# Patient Record
Sex: Female | Born: 1965 | Race: White | Hispanic: No | Smoking: Current every day smoker
Health system: Southern US, Community
[De-identification: ages and names within clinical notes are randomized; demographics above are authoritative.]

## PROBLEM LIST (undated history)

## (undated) DIAGNOSIS — K219 Gastro-esophageal reflux disease without esophagitis: Secondary | ICD-10-CM

## (undated) DIAGNOSIS — G2581 Restless legs syndrome: Secondary | ICD-10-CM

## (undated) DIAGNOSIS — E119 Type 2 diabetes mellitus without complications: Secondary | ICD-10-CM

## (undated) DIAGNOSIS — Z860100 Personal history of colon polyps, unspecified: Secondary | ICD-10-CM

## (undated) DIAGNOSIS — G4700 Insomnia, unspecified: Secondary | ICD-10-CM

## (undated) DIAGNOSIS — N63 Unspecified lump in unspecified breast: Secondary | ICD-10-CM

## (undated) DIAGNOSIS — I10 Essential (primary) hypertension: Secondary | ICD-10-CM

## (undated) DIAGNOSIS — N809 Endometriosis, unspecified: Secondary | ICD-10-CM

## (undated) DIAGNOSIS — E785 Hyperlipidemia, unspecified: Secondary | ICD-10-CM

## (undated) DIAGNOSIS — Z9889 Other specified postprocedural states: Secondary | ICD-10-CM

## (undated) DIAGNOSIS — R519 Headache, unspecified: Secondary | ICD-10-CM

## (undated) DIAGNOSIS — R51 Headache: Secondary | ICD-10-CM

## (undated) DIAGNOSIS — K859 Acute pancreatitis without necrosis or infection, unspecified: Secondary | ICD-10-CM

## (undated) DIAGNOSIS — R112 Nausea with vomiting, unspecified: Secondary | ICD-10-CM

## (undated) HISTORY — DX: Essential (primary) hypertension: I10

## (undated) HISTORY — DX: Gastro-esophageal reflux disease without esophagitis: K21.9

## (undated) HISTORY — PX: RECTOCELE REPAIR: SHX761

## (undated) HISTORY — PX: DIAGNOSTIC LAPAROSCOPY: SUR761

## (undated) HISTORY — PX: TONSILLECTOMY: SUR1361

## (undated) HISTORY — PX: ABDOMINAL HYSTERECTOMY: SHX81

## (undated) HISTORY — PX: TMJ ARTHROPLASTY: SHX1066

## (undated) HISTORY — DX: Hyperlipidemia, unspecified: E78.5

## (undated) HISTORY — PX: OTHER SURGICAL HISTORY: SHX169

---
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
## 1999-08-24 ENCOUNTER — Encounter: Payer: Self-pay | Admitting: Internal Medicine

## 1999-08-24 ENCOUNTER — Encounter: Admission: RE | Admit: 1999-08-24 | Discharge: 1999-08-24 | Payer: Self-pay | Admitting: Internal Medicine

## 2000-05-03 ENCOUNTER — Other Ambulatory Visit: Admission: RE | Admit: 2000-05-03 | Discharge: 2000-05-03 | Payer: Self-pay | Admitting: Obstetrics & Gynecology

## 2001-02-14 ENCOUNTER — Encounter (INDEPENDENT_AMBULATORY_CARE_PROVIDER_SITE_OTHER): Payer: Self-pay | Admitting: Specialist

## 2001-02-14 ENCOUNTER — Ambulatory Visit (HOSPITAL_COMMUNITY): Admission: RE | Admit: 2001-02-14 | Discharge: 2001-02-14 | Payer: Self-pay | Admitting: Obstetrics & Gynecology

## 2002-01-03 ENCOUNTER — Other Ambulatory Visit: Admission: RE | Admit: 2002-01-03 | Discharge: 2002-01-03 | Payer: Self-pay | Admitting: Obstetrics & Gynecology

## 2002-03-29 ENCOUNTER — Encounter: Admission: RE | Admit: 2002-03-29 | Discharge: 2002-03-29 | Payer: Self-pay | Admitting: Gastroenterology

## 2002-03-29 ENCOUNTER — Encounter: Payer: Self-pay | Admitting: Gastroenterology

## 2002-09-11 ENCOUNTER — Emergency Department (HOSPITAL_COMMUNITY): Admission: EM | Admit: 2002-09-11 | Discharge: 2002-09-11 | Payer: Self-pay | Admitting: Emergency Medicine

## 2002-09-18 ENCOUNTER — Emergency Department (HOSPITAL_COMMUNITY): Admission: EM | Admit: 2002-09-18 | Discharge: 2002-09-18 | Payer: Self-pay | Admitting: Emergency Medicine

## 2003-02-05 ENCOUNTER — Other Ambulatory Visit: Admission: RE | Admit: 2003-02-05 | Discharge: 2003-02-05 | Payer: Self-pay | Admitting: Obstetrics & Gynecology

## 2003-05-02 ENCOUNTER — Observation Stay (HOSPITAL_COMMUNITY): Admission: RE | Admit: 2003-05-02 | Discharge: 2003-05-03 | Payer: Self-pay | Admitting: Obstetrics & Gynecology

## 2004-01-09 ENCOUNTER — Ambulatory Visit (HOSPITAL_COMMUNITY): Admission: RE | Admit: 2004-01-09 | Discharge: 2004-01-09 | Payer: Self-pay | Admitting: Gastroenterology

## 2004-02-26 ENCOUNTER — Other Ambulatory Visit: Admission: RE | Admit: 2004-02-26 | Discharge: 2004-02-26 | Payer: Self-pay | Admitting: Obstetrics & Gynecology

## 2004-10-10 ENCOUNTER — Ambulatory Visit (HOSPITAL_COMMUNITY): Admission: RE | Admit: 2004-10-10 | Discharge: 2004-10-10 | Payer: Self-pay | Admitting: Internal Medicine

## 2005-04-12 ENCOUNTER — Other Ambulatory Visit: Admission: RE | Admit: 2005-04-12 | Discharge: 2005-04-12 | Payer: Self-pay | Admitting: Obstetrics & Gynecology

## 2005-04-20 ENCOUNTER — Encounter: Admission: RE | Admit: 2005-04-20 | Discharge: 2005-04-20 | Payer: Self-pay | Admitting: Obstetrics & Gynecology

## 2006-11-02 ENCOUNTER — Encounter: Admission: RE | Admit: 2006-11-02 | Discharge: 2006-11-02 | Payer: Self-pay | Admitting: Internal Medicine

## 2007-05-11 ENCOUNTER — Encounter: Admission: RE | Admit: 2007-05-11 | Discharge: 2007-05-11 | Payer: Self-pay | Admitting: Obstetrics & Gynecology

## 2010-10-11 ENCOUNTER — Encounter: Payer: Self-pay | Admitting: Obstetrics & Gynecology

## 2010-10-22 ENCOUNTER — Other Ambulatory Visit: Payer: Self-pay | Admitting: Internal Medicine

## 2010-10-23 ENCOUNTER — Ambulatory Visit
Admission: RE | Admit: 2010-10-23 | Discharge: 2010-10-23 | Disposition: A | Discharge status: Home / Self Care | Payer: Self-pay | Source: Ambulatory Visit | Attending: Internal Medicine | Admitting: Internal Medicine

## 2010-11-02 ENCOUNTER — Other Ambulatory Visit (HOSPITAL_COMMUNITY): Payer: Self-pay | Admitting: Gastroenterology

## 2010-11-02 DIAGNOSIS — R1013 Epigastric pain: Secondary | ICD-10-CM

## 2010-11-12 ENCOUNTER — Ambulatory Visit (HOSPITAL_COMMUNITY)
Admission: RE | Admit: 2010-11-12 | Discharge: 2010-11-12 | Disposition: A | Discharge status: Home / Self Care | Payer: 59 | Source: Ambulatory Visit | Attending: Gastroenterology | Admitting: Gastroenterology

## 2010-11-12 DIAGNOSIS — R1013 Epigastric pain: Secondary | ICD-10-CM

## 2010-11-12 DIAGNOSIS — R110 Nausea: Secondary | ICD-10-CM | POA: Insufficient documentation

## 2010-11-12 DIAGNOSIS — R109 Unspecified abdominal pain: Secondary | ICD-10-CM | POA: Insufficient documentation

## 2010-11-12 DIAGNOSIS — K3189 Other diseases of stomach and duodenum: Secondary | ICD-10-CM | POA: Insufficient documentation

## 2010-11-12 MED ORDER — TECHNETIUM TC 99M SULFUR COLLOID
2.0000 | Freq: Once | INTRAVENOUS | Status: AC | PRN
  Administered 2010-11-12: 2 via INTRAVENOUS

## 2010-11-26 ENCOUNTER — Other Ambulatory Visit: Payer: Self-pay | Admitting: Gastroenterology

## 2010-11-26 ENCOUNTER — Ambulatory Visit (HOSPITAL_COMMUNITY)
Admission: RE | Admit: 2010-11-26 | Discharge: 2010-11-26 | Disposition: A | Discharge status: Home / Self Care | Payer: 59 | Source: Ambulatory Visit | Attending: Gastroenterology | Admitting: Gastroenterology

## 2010-11-26 DIAGNOSIS — I10 Essential (primary) hypertension: Secondary | ICD-10-CM | POA: Insufficient documentation

## 2010-11-26 DIAGNOSIS — K219 Gastro-esophageal reflux disease without esophagitis: Secondary | ICD-10-CM | POA: Insufficient documentation

## 2010-11-26 DIAGNOSIS — R110 Nausea: Secondary | ICD-10-CM | POA: Insufficient documentation

## 2010-11-26 DIAGNOSIS — R6881 Early satiety: Secondary | ICD-10-CM | POA: Insufficient documentation

## 2010-12-03 NOTE — Op Note (Signed)
  NAMEJERILEE, SPACE                  ACCOUNT NO.:  [id number]  MEDICAL RECORD NO.:  [id number]           PATIENT TYPE:  O  LOCATION:  WLEN                         FACILITY:  [facility]  PHYSICIAN:  Danise Edge, M.D.   DATE OF BIRTH:  Sep 10, 1966  DATE OF PROCEDURE:  11/26/2010 DATE OF DISCHARGE:                              OPERATIVE REPORT   PROCEDURE:  Diagnostic esophagogastroduodenoscopy report.  REFERRING PHYSICIAN:  Thora Lance, M.D.  HISTORY:  Ms. Diamond [name] is a 45 year old female born on 1966/04/19.  The patient has chronic gastroesophageal reflux well controlled on proton pump inhibitor therapy.  She takes ReQuip at bedtime, but does not use nonsteroidal antiinflammatory medication.  She reports no heartburn, dysphagia, or dilatation.  For approximately 2 years, the patient has experienced early morning nausea without vomiting or abdominal pain.  Her episodes of nausea are increasing in frequency.  She also notes intermittent early satiety and bloating.  Two hours after eating a meal, the patient feels extremely hungry.  The patient's abdominal ultrasound was normal, but did suggest fatty infiltration of the liver.  Her CBC with differential, amylase, lipase, urinalysis, and complete metabolic profile were normal.  Last year, her thyroid stimulating hormone level was normal.  The patient underwent a nuclear medicine solid-phase gastric emptying study, which was carried out to 2 hours and showed 60% gastric retention at 2 hours.  The patient is scheduled to undergo diagnostic esophagogastroduodenoscopy to rule out mechanical gastric outlet obstruction.  ENDOSCOPIST:  Danise Edge, M.D.  PREMEDICATION:  Propofol administered by Anesthesia.  DESCRIPTION OF PROCEDURE:  After obtaining informed consent, the patient was placed in the left lateral decubitus position.  The Pentax gastroscope was passed through the posterior hypopharynx into  the proximal esophagus without difficulty.  The hypopharynx, larynx, and vocal cords appeared normal.  ESOPHAGOSCOPY:  The proximal, mid, and lower segments of the esophageal mucosa appeared normal.  The squamocolumnar junction is regular and noted at 40 cm from the incisor teeth.  There is no endoscopic evidence for the presence of erosive esophagitis, Barrett's esophagus, or esophageal stricture formation.  GASTROSCOPY:  Retroflexed view of the gastric cardia and fundus was normal.  The gastric body, antrum, and pylorus appeared normal.  The pylorus is patent.  DUODENOSCOPY:  The duodenal bulb and descending duodenum appeared normal.  GASTRIC BIOPSIES:  Random gastric biopsies were performed to look for H. pylori gastritis.  ASSESSMENT:  Normal esophagogastroduodenoscopy.  Gastric biopsies to rule out H. pylori gastritis pending.  No signs of Barrett's esophagus, erosive esophagitis, or mechanical gastric outlet obstruction.          ______________________________ Danise Edge, M.D.     MJ/MEDQ  D:  11/26/2010  T:  11/27/2010  Job:  [id number]  cc:   Thora Lance, M.D. Fax: [phone]  Electronically Signed by Danise Edge M.D. on 12/03/2010 04:54:26 PM

## 2011-02-05 NOTE — Op Note (Signed)
[facility]  Patient:    Diamond [name], Diamond [name] Visit Number: [id number] MRN: [id number]          Service Type: Attending:  Freddy Finner, M.D. Proc. Date: 02/14/01                             Operative Report  PREOPERATIVE DIAGNOSES:       1. Chronic right pelvic pain.                               2. Previous history of endometriosis.  POSTOPERATIVE DIAGNOSES:      1. Filmy pelvic adhesions.                               2. Multicystic right ovary.  OPERATION:                    1. Laparoscopy with lysis of adhesions.                               2. Right salpingo-oophorectomy.  SURGEON:                      Freddy Finner, M.D.  ANESTHESIA:                   General endotracheal.  ESTIMATE INTRAOPERATIVE BLOOD LOSS:  20 cc.  INTRAOPERATIVE COMPLICATIONS:     None.  INDICATIONS:                  The patient is a 45 year old who has previously had a hysterectomy and left salpingo-oophorectomy, with a history of endometriosis.  She has had chronic pain over the last one to two years in the right adnexa and lower abdomen.  She is admitted now for a laparoscopy.  FINDINGS:                     Are recorded in the still photographs and are essentially as noted in the postoperative diagnosis.  DESCRIPTION OF PROCEDURE:     The patient was admitted on the morning of surgery and was brought to the operating room and placed under adequate general endotracheal anesthesia, and placed in the dorsal lithotomy position using the Allen stirrups system.  A Betadine prep of the abdomen was carried out.  The bladder was evacuated with a sterile technique.  Sterile drapes were applied.  Two small incisions were made, one at the umbilicus, one just above the symphysis.  Through the upper incision the 11 mm disposable trocar was introduced while elevating the anterior abdominal wall manually.  Direct inspection revealed an adequate placement.  No evidence of injury on  entry.  A pneumoperitoneum was allowed to accumulate with carbon dioxide gas.  A 5 mm trocar was placed in the lower incision under direct visualization.  Through it a blunt probe.  Grasping forceps and later the Nezhat irrigation system were used.  Blunt dissection of adhesions easily freed those in the left and right pelvis.  They were very filmy.  These were adhesions to the sigmoid or epiploica sigmoid.  The ovary was multicystic and irregular in overall contour, but otherwise normal.  The ovary and tube were grasped with the grasping forceps  in the lower trocar sleeve.  Using the bipolar Klepinger forceps through the operating channel of the laparoscope the infundibular pelvic ligament and further attachment of the ovary was progressively fulgurated and sharply divided with the hot Bell scissors.  Hemostasis was established.  It was complete.  The ovary was more slowly removed in pieces through the trocar sleeve.  Irrigation was used with the Nezhat probe, and careful inspection revealed some small oozing sources which were controlled, while there was a little defect on the peritoneum anteriorly, which occurred during the morcellization of the ovary.  With complete hemostasis, all of the irrigation solution was aspirated from the abdomen.  Gas was allowed to escape.  The instruments were removed.  The skin incisions were closed with interrupted subcuticular sutures of #3-0 Dexon with Steri-Strips.  ________ 0.25% was injected through the incision sites for postoperative analgesia. Attending:  Freddy Finner, M.D. DD:  02/14/01 TD:  02/14/01 Job: [id number]

## 2011-02-05 NOTE — Op Note (Signed)
NAME:  Diamond [name], Diamond [name]                            ACCOUNT NO.:  [id number]   MEDICAL RECORD NO.:  [id number]                   PATIENT TYPE:  AMB   LOCATION:  ENDO                                 FACILITY:  [facility]   PHYSICIAN:  Danise Edge, M.D.                DATE OF BIRTH:  06/01/1966   DATE OF PROCEDURE:  01/09/2004  DATE OF DISCHARGE:                                 OPERATIVE REPORT   PROCEDURE:  Esophagogastroduodenoscopy.   INDICATIONS:  Ms. Diamond [name] is [name] 45 year old female born on October 13, 1965.  Diamond [name] has chronic gastroesophageal reflux manifested by  heartburn, regurgitation, and early satiety without nausea, vomiting, or  gastrointestinal bleeding.  Her current dose of Aciphex keeps her heartburn  under control, but not her regurgitation.  If she simply bends over to tie  her shoe, she will experience regurgitation.  She reports no dysphagia or  odynophagia.   On May 30, 2002, her barium swallow and upper GI x-ray series revealed  [name] patchulous, gastroesophageal junction without hiatal hernia, normal  transit of the 13 mm barium tablet into the stomach, and [name] normal stomach  and duodenum.   Her July, 2003 upper GI x-ray series demonstrated emptying of barium from  her stomach rapidly.  The patient tells me she feels full if she eats only [name]  few bites of food.   ENDOSCOPIST:  Danise Edge, M.D.   PREMEDICATION:  Versed 10 mg, Demerol 70 mg.   PROCEDURE:  After obtaining informed consent, Diamond [name] was placed in the  left lateral decubitus position.  I administered intravenous Demerol and  intravenous Versed to achieve conscious sedation for the procedure.  Patient's blood pressure, oxygen saturation, and cardiac rhythm were  monitored throughout the procedure and documented in the medical record.   The Olympus gastroscope was passed through the posterior hypopharynx and  into the proximal esophagus without difficulty.  The hypopharynx and larynx  appeared normal.  I did not visualize the vocal cords.   ESOPHAGOSCOPY:  The proximal, mid, and lower segments of the esophageal  mucosa appear completely normal.  The squamocolumnar junction is noted at 40  cm from the incisor teeth.  There is no endoscopic evidence for the presence  of erosive esophagitis, esophageal mucosal scarring, or Barrett's esophagus.   GASTROSCOPY:  Diamond [name] has [name] small hiatal hernia.  Retroflexed view of the  gastric cardia and fundus was normal.  The diaphragmatic hiatus was slightly  patulous.  The gastric body, antrum, and pylorus appeared normal.  The  pylorus is completely patent.   DUODENOSCOPY:  The duodenal bulb and descending duodenum appear normal.   ASSESSMENT:  Gastroesophageal reflux associated with [name] small hiatal hernia  and slightly patulous diaphragmatic hiatus, otherwise completely normal  esophagogastroduodenoscopy.   PLAN:  I will assess Diamond [name]'s response to Aciphex twice daily.  If the  response is poor,  I will add Reglan.  If no response, I will refer her for  consideration for antireflux surgery.                                               Danise Edge, M.D.    MJ/MEDQ  D:  01/09/2004  T:  01/10/2004  Job:  [id number]   cc:   Diamond [name], M.D.  [address]  [place]  [place] [address]  Fax: [phone]

## 2011-02-05 NOTE — H&P (Signed)
[name], [name]                            ACCOUNT NO.:  [id number]   MEDICAL RECORD NO.:  [id number]                   PATIENT TYPE:  AMB   LOCATION:  SDC                                  FACILITY:  [facility]   PHYSICIAN:  Freddy Finner, M.D.                DATE OF BIRTH:  January 09, 1966   DATE OF ADMISSION:  05/02/2003  DATE OF DISCHARGE:                                HISTORY & PHYSICAL   ADMISSION DIAGNOSIS:  Symptomatic rectocele.   HISTORY OF PRESENT ILLNESS:  The patient is a 45 year old white married  female who has significant past GYN history including laparoscopically-  assisted vaginal hysterectomy, left salpingo-oophorectomy in 1994 for  chronic pelvic pain and dysfunctional uterine bleeding.  She also was found  to have endometriosis.  In 2002, she had a right salpingo-oophorectomy and  lysis of pelvic adhesions for chronic right pelvic pain.  Her GYN symptoms  have been unremarkable until approximately three to six months prior to this  procedure when she noted some marked increased difficulty with emptying her  bowel. She has been evaluated by Danise Edge, M.D. for GI evaluation.  Her only abnormal finding is a rectocele which she has requested to be  surgically repaired.  She is admitted at this time for that purpose.   PAST MEDICAL HISTORY:  The patient is known to have irritable bowel  syndrome.  She has no other know significant medical illnesses.   PAST SURGICAL HISTORY:  She has had numerous operative procedures including  TMJ in 1997, diagnostic laparoscopy in 1982, the two surgical procedures  noted above.  She had surgical sterilization in 1990.   CURRENT MEDICATIONS:  None except for hormone replacement therapy.   SOCIAL HISTORY:  She is not a smoker.  She has never had a blood  transfusion.   ALLERGIES:  No known drug allergies.   FAMILY HISTORY:  Noncontributory.   PHYSICAL EXAMINATION:  HEENT:  Grossly within normal limits.  Thyroid gland  is not  palpably enlarged.  VITAL SIGNS:  Blood pressure in the office is 106/70.  CHEST:  Clear to auscultation.  HEART:  Normal sinus rhythm without murmurs, rubs, or gallops.  BREASTS: Normal, no palpable masses, no skin change, no nipple discharge.  ABDOMEN:  Soft and nontender without appreciable organomegaly or palpable  masses.  PELVIC:  Rectocele is noted. The cuff is otherwise normal. Bimanual  examination is normal including rectovaginal examination.  EXTREMITIES:  Without cyanosis, clubbing, or edema.   ASSESSMENT:  Symptomatic rectocele.    PLAN:  Posterior vaginal colporrhaphy.  The procedure has been described to  the patient in detail including the potential risks of the procedure.  She  has been asked to use a modified bowel prep including Milk of Magnesia at  bedtime for two days prior to surgery.  Freddy Finner, M.D.    WRN/MEDQ  D:  05/01/2003  T:  05/01/2003  Job:  [id number]

## 2011-02-05 NOTE — Op Note (Signed)
   NAMEJENNIER, Diamond [name]                            ACCOUNT NO.:  [id number]   MEDICAL RECORD NO.:  [id number]                   PATIENT TYPE:  OBV   LOCATION:  9305                                 FACILITY:  [facility]   PHYSICIAN:  Freddy Finner, M.D.                DATE OF BIRTH:  01/03/1966   DATE OF PROCEDURE:  05/02/2003  DATE OF DISCHARGE:                                 OPERATIVE REPORT   PREOPERATIVE DIAGNOSIS:  Symptomatic rectocele.   POSTOPERATIVE DIAGNOSIS:  Symptomatic rectocele.   OPERATIVE PROCEDURE:  Posterior colporrhaphy.   SURGEON:  Freddy Finner, M.D.   ANESTHESIA:  General.   ESTIMATED INTRAOPERATIVE BLOOD LOSS:  200 mL.   INTRAOPERATIVE COMPLICATIONS:  None.   Details of the present illness are recorded in the admission note.  The  patient was admitted on the morning of surgery.  She was given an antibiotic  bolus preoperatively.  She was placed in PAS hose.  She was brought to the  operating room and placed under adequate general anesthesia.  She was placed  in the dorsolithotomy position using the [employer].  Betadine  prep with scrub followed by solution was carried out in the usual fashion.  Sterile drapes were applied.  The fourchette was grasped on each side with  an Allis clamp.  A pyramidal shaped segment of skin was excised from the  peritoneal body with apex anteriorly.  The mucosa overlying the rectum was  tented with an Allis and with progressive sharp and blunt dissection the  mucosa was dissected off of the rectum and perirectal tissues and a midline  incision made for a distance of approximately 6-8 cm.  After very careful  dissection, plication sutures were then placed using the perirectal tissue  to recreate the rectovaginal septum using 0 Monocryl pop-off sutures.  This  was also used to approximate the perineal body.  The small segments of  mucosa were excised.  The mucosa was then closed with running 2-0 Monocryl  in a fashion  similar to episiotomy.  The procedure was accomplished without  significant difficulty.  The patient was then awaken and taken to recovery  in good condition.                                               Freddy Finner, M.D.    WRN/MEDQ  D:  05/02/2003  T:  05/02/2003  Job:  [id number]

## 2011-02-05 NOTE — Discharge Summary (Signed)
   [name], Diamond [name]                            ACCOUNT NO.:  [id number]   MEDICAL RECORD NO.:  [id number]                   PATIENT TYPE:  OBV   LOCATION:  9305                                 FACILITY:  [facility]   PHYSICIAN:  Freddy Finner, M.D.                DATE OF BIRTH:  June 12, 1966   DATE OF ADMISSION:  05/02/2003  DATE OF DISCHARGE:  05/03/2003                                 DISCHARGE SUMMARY   DISCHARGE DIAGNOSIS:  Symptomatic rectocele.   OPERATIVE PROCEDURE:  Posterior colporrhaphy with repair of rectocele.   POSTOPERATIVE COMPLICATIONS:  None.   DISPOSITION:  The patient was in satisfactory and improved condition after  an observation night following surgery.  She was discharged home on stool  softener with progressively increasing physical activity, no vaginal entry,  instructions to call for fever or heavy bleeding.  She was to use Vicodin  and ibuprofen as needed for postoperative pain.   HOSPITAL COURSE:  Details of the present illness are recorded in the  admission note.  The patient was admitted on the morning of surgery.  Her  physical findings were remarkable for a rectocele.   Laboratory data during this admission includes a preoperative hemoglobin of  14.9, postoperative hemoglobin of 12.5, admission prothrombin time and PTT  which were normal.   The patient was admitted on the morning of surgery.  The above-described  operative procedure was accomplished.  There were no intraoperative or  postoperative complications.  She was observed overnight for postoperative  follow-up and by the morning of surgery was tolerating a regular diet,  ambulating without difficulty, having adequate bladder function.  Her  disposition is noted above.                                               Freddy Finner, M.D.    WRN/MEDQ  D:  05/22/2003  T:  05/22/2003  Job:  [id number]

## 2011-07-14 ENCOUNTER — Other Ambulatory Visit: Payer: Self-pay | Admitting: Obstetrics & Gynecology

## 2011-07-14 DIAGNOSIS — N63 Unspecified lump in unspecified breast: Secondary | ICD-10-CM

## 2011-07-27 ENCOUNTER — Ambulatory Visit
Admission: RE | Admit: 2011-07-27 | Discharge: 2011-07-27 | Disposition: A | Discharge status: Home / Self Care | Payer: 59 | Source: Ambulatory Visit | Attending: Obstetrics & Gynecology | Admitting: Obstetrics & Gynecology

## 2011-07-27 ENCOUNTER — Other Ambulatory Visit: Payer: Self-pay | Admitting: Obstetrics & Gynecology

## 2011-07-27 DIAGNOSIS — N63 Unspecified lump in unspecified breast: Secondary | ICD-10-CM

## 2013-02-19 ENCOUNTER — Encounter: Payer: Self-pay | Admitting: *Deleted

## 2013-02-19 ENCOUNTER — Encounter: Payer: 59 | Attending: Internal Medicine | Admitting: *Deleted

## 2013-02-19 VITALS — Ht 63.5 in | Wt 195.0 lb

## 2013-02-19 DIAGNOSIS — E781 Pure hyperglyceridemia: Secondary | ICD-10-CM | POA: Insufficient documentation

## 2013-02-19 DIAGNOSIS — Z713 Dietary counseling and surveillance: Secondary | ICD-10-CM | POA: Insufficient documentation

## 2013-02-19 DIAGNOSIS — E669 Obesity, unspecified: Secondary | ICD-10-CM

## 2013-02-19 DIAGNOSIS — K219 Gastro-esophageal reflux disease without esophagitis: Secondary | ICD-10-CM | POA: Insufficient documentation

## 2013-02-19 NOTE — Progress Notes (Signed)
  Medical Nutrition Therapy:  Appt start time: 1430 end time:  1530.   Assessment:  Primary concerns today: Diamond [name] is here for nutrition counseling pertaining to obesity.  She reports always struggling with her weight as an adult.  After having her second child she has had a very difficult time managing her weight.  During emotional times, she looses weight from not eating.  She has GERD and gastroparesis and elevated triglycerides, cholesterol, and there are some concerns about hyperglycemia.  She is quite concerned about her weight/health.  She feels like she doesn't eat very much and she doesn't understand what the difficulty is with her weight.  She had a hysterectomy years ago due to cystic ovaries.  She is receiving HRT currently.  She has facial hair that she shaves off and periodic body acne.  She used to eat fast food regularly, but doesn't anymore and is quite upset about her triglyceride levels.  She eats smaller portions due to her gastroparesis and denies overeating.   MEDICATIONS: see    DIETARY INTAKE:  Usual eating pattern includes 3 meals and 2-3 snacks per day.  Everyday foods include proteins, refined carbohydrates some vegetables.  States she's a picky eater.  Loves salty foods and sugary cereals.  Drinks whole milk.  Avoided foods include cooked vegetables.  She likes raw vegetables, but not cooked ones.  .    24-hr recall:  B ( AM): granola bar, lance crackers, BLT sometimes at work.  Used to have ham biscuit qd, but now only 1/week Snk ( AM): maybe cracker if nauseous L ( PM): Diamond [name] pizza, rarely fast food.  Might get burger rarely.  Salads,  Loves bread Snk ( PM): chips maybe D ( PM): varies: cereal, banana sandwich with mayo on whitewheat bread, PB and J, chips, or might go out.  Never cooks at home.  Might get [place] or pizza or [place].   Snk ( PM): might have chips or chocolate pudding if she ate a light supper Beverages: water, sweet tea  Usual physical  activity: none.  Is doing PT with urologist   Estimated energy needs: 1600 calories 180 g carbohydrates 120 g protein 44 g fat  Progress Towards Goal(s):  In progress.   Nutritional Diagnosis:  NB-1.1 Food and nutrition-related knowledge deficit As related to proper balance of fats, carbohydrates, and proteins.  As evidenced by BMI and elevated lab values.    Intervention:  Nutrition counseling provided.  Suggested speaking with her doctor about additional testing: possibly thyroid, possibly insulin, possibly testosterone levels.  She has some symptoms consistent with PCOS, but it's difficult to tell due to her hysterectomy and HRT.  Encouraged 5-6 small eating occasions throughout the day: carbohydrate and protein at each "meal."  Suggested limiting sweet tea and potato chips.  Also suggested adding walking into her routine.  Suggested more water and healthy fats (nuts, peanut butter) as snacks.    Monitoring/Evaluation:  Dietary intake, exercise, lab data, and body weight prn.

## 2013-02-19 NOTE — Progress Notes (Deleted)
  Medical Nutrition Therapy:  Appt start time: {Time; Appointment:21385} end time:  {Time; Appointment:21385}.   Assessment:  Primary concerns today: Diamond [name] is here for n.  She has e MEDICATIONS: see    DIETARY INTAKE:  Usual eating pattern includes *** meals and *** snacks per day.  Everyday foods include ***.  Avoided foods include ***.    24-hr recall:  B ( AM): gran  Snk ( AM): ***  L ( PM): *** Snk ( PM): *** D ( PM): *** Snk ( PM): *** Beverages: ***  Usual physical activity: ***  Estimated energy needs: *** calories *** g carbohydrates *** g protein *** g fat  Progress Towards Goal(s):  {Desc; Goals Progress:21388}.   Nutritional Diagnosis:  {CHL AMB NUTRITIONAL DIAGNOSIS:[phone]}    Intervention:  Nutrition conse.  Handouts given during visit include:  ***  ***  Monitoring/Evaluation:  Dietary intake, exercise, ***, and body weight {follow up:15908}.

## 2013-05-09 ENCOUNTER — Emergency Department (HOSPITAL_COMMUNITY)
Admission: EM | Admit: 2013-05-09 | Discharge: 2013-05-10 | Disposition: A | Discharge status: Home / Self Care | Payer: 59 | Attending: Emergency Medicine | Admitting: Emergency Medicine

## 2013-05-09 ENCOUNTER — Emergency Department (HOSPITAL_COMMUNITY): Payer: 59

## 2013-05-09 ENCOUNTER — Encounter (HOSPITAL_COMMUNITY): Payer: Self-pay | Admitting: Emergency Medicine

## 2013-05-09 DIAGNOSIS — Z862 Personal history of diseases of the blood and blood-forming organs and certain disorders involving the immune mechanism: Secondary | ICD-10-CM | POA: Insufficient documentation

## 2013-05-09 DIAGNOSIS — Z8639 Personal history of other endocrine, nutritional and metabolic disease: Secondary | ICD-10-CM | POA: Insufficient documentation

## 2013-05-09 DIAGNOSIS — K209 Esophagitis, unspecified without bleeding: Secondary | ICD-10-CM | POA: Insufficient documentation

## 2013-05-09 DIAGNOSIS — R0789 Other chest pain: Secondary | ICD-10-CM

## 2013-05-09 DIAGNOSIS — I10 Essential (primary) hypertension: Secondary | ICD-10-CM | POA: Insufficient documentation

## 2013-05-09 DIAGNOSIS — Z79899 Other long term (current) drug therapy: Secondary | ICD-10-CM | POA: Insufficient documentation

## 2013-05-09 DIAGNOSIS — K219 Gastro-esophageal reflux disease without esophagitis: Secondary | ICD-10-CM | POA: Insufficient documentation

## 2013-05-09 DIAGNOSIS — IMO0002 Reserved for concepts with insufficient information to code with codable children: Secondary | ICD-10-CM | POA: Insufficient documentation

## 2013-05-09 DIAGNOSIS — Z8669 Personal history of other diseases of the nervous system and sense organs: Secondary | ICD-10-CM | POA: Insufficient documentation

## 2013-05-09 DIAGNOSIS — F17200 Nicotine dependence, unspecified, uncomplicated: Secondary | ICD-10-CM | POA: Insufficient documentation

## 2013-05-09 HISTORY — DX: Restless legs syndrome: G25.81

## 2013-05-09 LAB — CBC
HCT: 41.7 % (ref 36.0–46.0)
MCHC: 35.5 g/dL (ref 30.0–36.0)
Platelets: 237 10*3/uL (ref 150–400)
RDW: 13.6 % (ref 11.5–15.5)
WBC: 12.7 10*3/uL — ABNORMAL HIGH (ref 4.0–10.5)

## 2013-05-09 MED ORDER — GI COCKTAIL ~~LOC~~
30.0000 mL | Freq: Once | ORAL | Status: AC
  Administered 2013-05-10: 30 mL via ORAL
  Filled 2013-05-09: qty 30

## 2013-05-09 NOTE — ED Notes (Signed)
PT. REPORTS MID CHEST PAIN AND UPPER BACK ONSET LAST NIGHT WITH SLIGHT SOB AND NAUSEA .

## 2013-05-10 ENCOUNTER — Emergency Department (HOSPITAL_COMMUNITY): Payer: 59

## 2013-05-10 LAB — HEPATIC FUNCTION PANEL
ALT: 11 U/L (ref 0–35)
AST: 14 U/L (ref 0–37)
Alkaline Phosphatase: 46 U/L (ref 39–117)
Bilirubin, Direct: <0.1 mg/dL (ref 0.0–0.3)
Total Bilirubin: 0.1 mg/dL — ABNORMAL LOW (ref 0.3–1.2)

## 2013-05-10 LAB — BASIC METABOLIC PANEL
BUN: 12 mg/dL (ref 6–23)
Chloride: 100 mEq/L (ref 96–112)
GFR calc Af Amer: >90 mL/min (ref >90)
GFR calc non Af Amer: >90 mL/min (ref >90)
Potassium: 3.2 mEq/L — ABNORMAL LOW (ref 3.5–5.1)

## 2013-05-10 LAB — PRO B NATRIURETIC PEPTIDE: Pro B Natriuretic peptide (BNP): 37.1 pg/mL (ref 0–125)

## 2013-05-10 MED ORDER — HYDROMORPHONE HCL PF 1 MG/ML IJ SOLN
0.5000 mg | Freq: Once | INTRAMUSCULAR | Status: AC
  Administered 2013-05-10: 0.5 mg via INTRAMUSCULAR
  Filled 2013-05-10: qty 1

## 2013-05-10 MED ORDER — ONDANSETRON 4 MG PO TBDP
8.0000 mg | ORAL_TABLET | Freq: Once | ORAL | Status: AC
  Administered 2013-05-10: 8 mg via ORAL
  Filled 2013-05-10: qty 2

## 2013-05-10 MED ORDER — SUCRALFATE 1 G PO TABS: 1.0000 g | ORAL_TABLET | Freq: Four times a day (QID) | ORAL | Status: DC

## 2013-05-10 MED ORDER — HYDROCODONE-ACETAMINOPHEN 5-325 MG PO TABS: 2.0000 | ORAL_TABLET | ORAL | Status: DC | PRN

## 2013-05-10 MED ORDER — HYDROMORPHONE HCL PF 1 MG/ML IJ SOLN: 0.5000 mg | Freq: Once | INTRAMUSCULAR | Status: DC

## 2013-05-10 MED ORDER — FENTANYL CITRATE 0.05 MG/ML IJ SOLN
50.0000 ug | Freq: Once | INTRAMUSCULAR | Status: DC
  Filled 2013-05-10: qty 2

## 2013-05-10 MED ORDER — ONDANSETRON 8 MG PO TBDP: 8.0000 mg | ORAL_TABLET | Freq: Three times a day (TID) | ORAL | Status: DC | PRN

## 2013-05-10 MED ORDER — SUCRALFATE 1 G PO TABS
1.0000 g | ORAL_TABLET | Freq: Once | ORAL | Status: AC
  Administered 2013-05-10: 1 g via ORAL
  Filled 2013-05-10: qty 1

## 2013-05-10 NOTE — ED Notes (Signed)
Pt c/o nausea, EDP made aware 

## 2013-05-10 NOTE — ED Provider Notes (Signed)
CSN: [id number]     Arrival date & time 05/09/13  2310 History     First MD Initiated Contact with Patient 05/09/13 2338     Chief Complaint  Patient presents with  . Chest Pain   (Consider location/radiation/quality/duration/timing/severity/associated sxs/prior Treatment) HPI 47 yo female presents to the ER from home with complaint of central chest pain with radiation into her back between the shoulder blades.  Pain started last night, attributed to gas.  Started up again this afternoon and has gotten progressively worse.  Pain has been constant since afternoon.  Pain is sharp.  No diaphoresis, some mild sob.  No fever, chills.  No prior h/o same.  Pt has taken full strength aspirin x 2 and tums without relief.  Pt has h/o HTN, HLD, and GERD.  Pt reports h/o "stomach problems" with gastroparesis, chronic nausea  Past Medical History  Diagnosis Date  . GERD (gastroesophageal reflux disease)   . Hypertension   . Hyperlipidemia   . Restless leg syndrome    Past Surgical History  Procedure Laterality Date  . Abdominal hysterectomy    . Tmj arthroplasty     No family history on file. History  Substance Use Topics  . Smoking status: Current Every Day Smoker -- 0.75 packs/day    Types: Cigarettes  . Smokeless tobacco: Not on file  . Alcohol Use: No   OB History   Grav Para Term Preterm Abortions TAB SAB Ect Mult Living                 Review of Systems  All other systems reviewed and are negative.    Allergies  Review of patient's allergies indicates no known allergies.  Home Medications   Current Outpatient Rx  Name  Route  Sig  Dispense  Refill  . esomeprazole (NEXIUM) 40 MG packet   Oral   Take 40 mg by mouth daily before breakfast.         . estrogens, conjugated, (PREMARIN) 1.25 MG tablet   Oral   Take 1.25 mg by mouth daily.         . hydrochlorothiazide (HYDRODIURIL) 25 MG tablet   Oral   Take 25 mg by mouth daily.         Marland Kitchen LORazepam (ATIVAN) 0.5  MG tablet   Oral   Take 0.5 mg by mouth every 8 (eight) hours.         Marland Kitchen rOPINIRole (REQUIP) 1 MG tablet   Oral   Take 1 mg by mouth 3 (three) times daily.         Marland Kitchen zolpidem (AMBIEN) 10 MG tablet   Oral   Take 10 mg by mouth at bedtime as needed for sleep.          BP 155/81  Pulse 77  Temp(Src) 98.2 F (36.8 C) (Oral)  Resp 20  SpO2 97% Physical Exam  Nursing note and vitals reviewed. Constitutional: She is oriented to person, place, and time. She appears well-developed and well-nourished. She appears distressed (uncomfortable).  HENT:  Head: Normocephalic and atraumatic.  Nose: Nose normal.  Mouth/Throat: Oropharynx is clear and moist.  Eyes: Conjunctivae and EOM are normal. Pupils are equal, round, and reactive to light.  Neck: Normal range of motion. Neck supple. No JVD present. No tracheal deviation present. No thyromegaly present.  Cardiovascular: Normal rate, regular rhythm, normal heart sounds and intact distal pulses.  Exam reveals no gallop and no friction rub.   No murmur heard. Pulmonary/Chest:  Effort normal and breath sounds normal. No stridor. No respiratory distress. She has no wheezes. She has no rales. She exhibits no tenderness.  Abdominal: Soft. Bowel sounds are normal. She exhibits no distension and no mass. There is tenderness (moderate tenderness to epigastrium, RUQ). There is no rebound and no guarding.  Musculoskeletal: Normal range of motion. She exhibits no edema and no tenderness.  Lymphadenopathy:    She has no cervical adenopathy.  Neurological: She is alert and oriented to person, place, and time. She exhibits normal muscle tone. Coordination normal.  Skin: Skin is warm and dry. No rash noted. No erythema. No pallor.  Psychiatric: She has a normal mood and affect. Her behavior is normal. Judgment and thought content normal.    ED Course   Procedures (including critical care time)  Labs Reviewed  CBC - Abnormal; Notable for the  following:    WBC 12.7 (*)    All other components within normal limits  BASIC METABOLIC PANEL - Abnormal; Notable for the following:    Potassium 3.2 (*)    Glucose, Bld 113 (*)    All other components within normal limits  HEPATIC FUNCTION PANEL - Abnormal; Notable for the following:    Albumin 3.4 (*)    Total Bilirubin 0.1 (*)    All other components within normal limits  LIPASE, BLOOD - Abnormal; Notable for the following:    Lipase 68 (*)    All other components within normal limits  PRO B NATRIURETIC PEPTIDE  POCT I-STAT TROPONIN I  POCT I-STAT TROPONIN I   Dg Chest 2 View  05/10/2013   *RADIOLOGY REPORT*  Clinical Data: Chest pain and right shoulder pain.  CHEST - 2 VIEW  Comparison: CT chest 11/02/2006  Findings: The heart size and pulmonary vascularity are normal. The lungs appear clear and expanded without focal air space disease or consolidation. No blunting of the costophrenic angles.  No pneumothorax.  Mediastinal contours appear intact.  IMPRESSION: No evidence of active pulmonary disease.   Original Report Authenticated By: Burman Nieves, M.D.    Date: 05/09/2013  Rate: 84  Rhythm: normal sinus rhythm and sinus arrhythmia  QRS Axis: normal  Intervals: normal  ST/T Wave abnormalities: normal  Conduction Disutrbances:none  Narrative Interpretation:   Old EKG Reviewed: none available    1. Atypical chest pain   2. Esophagitis     MDM  47 yo female with central chest pain with radiation to back.  EKG normal, initial troponin negative with ongoing pain for 8 hours.  Suspect GI origin of pain.  Do not feel sxs due to aortic dissection/aneurysm, PE.  Will add on LFTs, lipase and get u/s. Will treat with gi cocktail.  U/s without acute findings.  Two troponins negative.  Improvement with gi coctail.  Will d/c to f/u with pcm and GI.     Olivia Mackie, MD 05/10/13 [phone]

## 2014-03-25 ENCOUNTER — Other Ambulatory Visit: Payer: Self-pay | Admitting: Gastroenterology

## 2014-05-20 ENCOUNTER — Encounter (HOSPITAL_COMMUNITY): Payer: Self-pay | Admitting: Pharmacy Technician

## 2014-05-24 ENCOUNTER — Encounter (HOSPITAL_COMMUNITY): Payer: Self-pay | Admitting: *Deleted

## 2014-06-04 ENCOUNTER — Ambulatory Visit (HOSPITAL_COMMUNITY)
Admission: RE | Admit: 2014-06-04 | Discharge: 2014-06-04 | Disposition: A | Discharge status: Home / Self Care | Payer: 59 | Source: Ambulatory Visit | Attending: Gastroenterology | Admitting: Gastroenterology

## 2014-06-04 ENCOUNTER — Encounter (HOSPITAL_COMMUNITY)
Admission: RE | Disposition: A | Discharge status: Home / Self Care | Payer: Self-pay | Source: Ambulatory Visit | Attending: Gastroenterology

## 2014-06-04 ENCOUNTER — Encounter (HOSPITAL_COMMUNITY): Payer: Self-pay | Admitting: *Deleted

## 2014-06-04 ENCOUNTER — Ambulatory Visit (HOSPITAL_COMMUNITY): Payer: 59 | Admitting: Anesthesiology

## 2014-06-04 ENCOUNTER — Encounter (HOSPITAL_COMMUNITY): Payer: 59 | Admitting: Anesthesiology

## 2014-06-04 DIAGNOSIS — M545 Low back pain, unspecified: Secondary | ICD-10-CM | POA: Diagnosis not present

## 2014-06-04 DIAGNOSIS — G8929 Other chronic pain: Secondary | ICD-10-CM | POA: Insufficient documentation

## 2014-06-04 DIAGNOSIS — Z87891 Personal history of nicotine dependence: Secondary | ICD-10-CM | POA: Insufficient documentation

## 2014-06-04 DIAGNOSIS — D126 Benign neoplasm of colon, unspecified: Secondary | ICD-10-CM | POA: Insufficient documentation

## 2014-06-04 DIAGNOSIS — I10 Essential (primary) hypertension: Secondary | ICD-10-CM | POA: Insufficient documentation

## 2014-06-04 DIAGNOSIS — F411 Generalized anxiety disorder: Secondary | ICD-10-CM | POA: Insufficient documentation

## 2014-06-04 DIAGNOSIS — G2581 Restless legs syndrome: Secondary | ICD-10-CM | POA: Diagnosis not present

## 2014-06-04 DIAGNOSIS — G4700 Insomnia, unspecified: Secondary | ICD-10-CM | POA: Insufficient documentation

## 2014-06-04 DIAGNOSIS — Z1211 Encounter for screening for malignant neoplasm of colon: Secondary | ICD-10-CM | POA: Insufficient documentation

## 2014-06-04 DIAGNOSIS — K3184 Gastroparesis: Secondary | ICD-10-CM | POA: Diagnosis not present

## 2014-06-04 DIAGNOSIS — Z90710 Acquired absence of both cervix and uterus: Secondary | ICD-10-CM | POA: Diagnosis not present

## 2014-06-04 HISTORY — PX: COLONOSCOPY WITH PROPOFOL: SHX5780

## 2014-06-04 HISTORY — DX: Insomnia, unspecified: G47.00

## 2014-06-04 SURGERY — COLONOSCOPY WITH PROPOFOL
Anesthesia: General

## 2014-06-04 MED ORDER — PROPOFOL 10 MG/ML IV BOLUS
INTRAVENOUS | Status: AC
  Filled 2014-06-04: qty 20

## 2014-06-04 MED ORDER — KETAMINE HCL 10 MG/ML IJ SOLN
INTRAMUSCULAR | Status: DC | PRN
  Administered 2014-06-04: 20 mg via INTRAVENOUS

## 2014-06-04 MED ORDER — PROPOFOL INFUSION 10 MG/ML OPTIME
INTRAVENOUS | Status: DC | PRN
  Administered 2014-06-04: 300 ug/kg/min via INTRAVENOUS

## 2014-06-04 MED ORDER — ONDANSETRON HCL 4 MG/2ML IJ SOLN
INTRAMUSCULAR | Status: AC
  Filled 2014-06-04: qty 2

## 2014-06-04 MED ORDER — LACTATED RINGERS IV SOLN
INTRAVENOUS | Status: DC
  Administered 2014-06-04: 1000 mL via INTRAVENOUS

## 2014-06-04 MED ORDER — ONDANSETRON HCL 4 MG/2ML IJ SOLN
INTRAMUSCULAR | Status: DC | PRN
  Administered 2014-06-04: 4 mg via INTRAVENOUS

## 2014-06-04 MED ORDER — LIDOCAINE HCL (CARDIAC) 20 MG/ML IV SOLN
INTRAVENOUS | Status: AC
  Filled 2014-06-04: qty 5

## 2014-06-04 MED ORDER — LIDOCAINE HCL (CARDIAC) 20 MG/ML IV SOLN
INTRAVENOUS | Status: DC | PRN
  Administered 2014-06-04: 100 mg via INTRAVENOUS

## 2014-06-04 SURGICAL SUPPLY — 21 items

## 2014-06-04 NOTE — Op Note (Signed)
Procedure: Screening colonoscopy. Normal screening colonoscopy performed on 02/11/1997  Endoscopist: Earle Gell  Premedication: Propofol administered by anesthesia  Procedure: The patient was placed in the left lateral decubitus position. Anal inspection and digital rectal exam were normal. The Pentax pediatric colonoscope was introduced into the rectum and easily advanced to the cecum. A normal-appearing appendiceal orifice was visualized. A normal-appearing ileocecal valve was intubated and the terminal ileum inspected. Colonic preparation for the exam today was good.  Rectum. Normal. Retroflexed view of the distal rectum normal  Sigmoid colon. From the distal sigmoid colon, two 3 mm size sessile polyps were removed with the cold snare. From the proximal sigmoid colon at 40 cm from the anal verge, a 5 mm sized sessile polyp was removed with the cold snare.  Descending colon. Normal  Splenic flexure. Normal  Transverse colon. Normal  Hepatic flexure. Normal  Ascending colon. Normal  Cecum and ileocecal valve. Normal  Terminal ileum. Normal  Assessment: Three small sigmoid colon polyps were removed with the cold snare and submitted in one bottle for pathological evaluation. Otherwise normal colonoscopy.  Recommendation: If polyps return neoplastic pathologically, the patient should undergo a surveillance colonoscopy in 5 years. If the polyps returned nonneoplastic pathologically, she should undergo a screening colonoscopy in 10 years.

## 2014-06-04 NOTE — Transfer of Care (Signed)
Immediate Anesthesia Transfer of Care Note  Patient: Diamond [name]  Procedure(s) Performed: Procedure(s) (LRB): COLONOSCOPY WITH PROPOFOL (N/A)  Patient Location: PACU  Anesthesia Type: MAC  Level of Consciousness: sedated, patient cooperative and responds to stimulation  Airway & Oxygen Therapy: Patient Spontanous Breathing and Patient connected to face mask oxgen  Post-op Assessment: Report given to PACU RN and Post -op Vital signs reviewed and stable  Post vital signs: Reviewed and stable  Complications: No apparent anesthesia complications

## 2014-06-04 NOTE — H&P (Signed)
  Procedure: Screening colonoscopy. Normal screening colonoscopy performed on 02/11/1997.  History: The patient is a 48 year old female who is scheduled to undergo a repeat screening colonoscopy with polypectomy to prevent colon cancer. There is no family history of colon cancer.  Past medical history: Hypertension. Chronic back pain. Restless leg syndrome. Gastroesophageal reflux. Anxiety with insomnia. Gastroparesis. Total abdominal hysterectomy. Bilateral salpingo-oophorectomy. Endometriosis. TMJ syndrome.  Exam: The patient is alert and lying comfortably on the endoscopy stretcher. Lungs are clear to auscultation. Cardiac exam reveals a regular rhythm. Abdomen is soft and nontender to palpation.  Plan: Proceed with repeat screening colonoscopy

## 2014-06-04 NOTE — Anesthesia Postprocedure Evaluation (Signed)
Anesthesia Post Note  Patient: Diamond [name]  Procedure(s) Performed: Procedure(s) (LRB): COLONOSCOPY WITH PROPOFOL (N/A)  Anesthesia type: MAC  Patient location: PACU  Post pain: Pain level controlled  Post assessment: Post-op Vital signs reviewed  Last Vitals: BP 133/68  Pulse 71  Temp(Src) 37.3 C (Oral)  Resp 19  Ht 5' 3.5" (1.613 m)  Wt 194 lb (87.998 kg)  BMI 33.82 kg/m2  SpO2 100%  Post vital signs: Reviewed  Level of consciousness: awake  Complications: No apparent anesthesia complications

## 2014-06-04 NOTE — Discharge Instructions (Signed)

## 2014-06-04 NOTE — Anesthesia Preprocedure Evaluation (Signed)
Anesthesia Evaluation  Patient identified by MRN, date of birth, ID band Patient awake    Reviewed: Allergy & Precautions, H&P , NPO status , Patient's Chart, lab work & pertinent test results  Airway Mallampati: II TM Distance: >3 FB Neck ROM: Full    Dental no notable dental hx.    Pulmonary former smoker,  breath sounds clear to auscultation  Pulmonary exam normal       Cardiovascular hypertension, Pt. on medications Rhythm:Regular Rate:Normal     Neuro/Psych negative neurological ROS  negative psych ROS   GI/Hepatic Neg liver ROS, GERD-  ,  Endo/Other  negative endocrine ROS  Renal/GU negative Renal ROS     Musculoskeletal negative musculoskeletal ROS (+)   Abdominal   Peds  Hematology negative hematology ROS (+)   Anesthesia Other Findings   Reproductive/Obstetrics negative OB ROS                           Anesthesia Physical Anesthesia Plan  ASA: II  Anesthesia Plan: General   Post-op Pain Management:    Induction: Intravenous  Airway Management Planned:   Additional Equipment:   Intra-op Plan:   Post-operative Plan: Extubation in OR  Informed Consent: I have reviewed the patients History and Physical, chart, labs and discussed the procedure including the risks, benefits and alternatives for the proposed anesthesia with the patient or authorized representative who has indicated his/her understanding and acceptance.   Dental advisory given  Plan Discussed with: CRNA  Anesthesia Plan Comments:         Anesthesia Quick Evaluation

## 2014-06-05 ENCOUNTER — Encounter (HOSPITAL_COMMUNITY): Payer: Self-pay | Admitting: Gastroenterology

## 2014-08-19 ENCOUNTER — Other Ambulatory Visit: Payer: Self-pay | Admitting: Obstetrics & Gynecology

## 2014-08-21 LAB — CYTOLOGY - PAP

## 2015-09-21 DIAGNOSIS — K859 Acute pancreatitis without necrosis or infection, unspecified: Secondary | ICD-10-CM

## 2015-09-21 HISTORY — DX: Acute pancreatitis without necrosis or infection, unspecified: K85.90

## 2016-02-19 ENCOUNTER — Other Ambulatory Visit: Payer: Self-pay | Admitting: Internal Medicine

## 2016-02-19 DIAGNOSIS — R519 Headache, unspecified: Secondary | ICD-10-CM

## 2016-02-19 DIAGNOSIS — R51 Headache: Principal | ICD-10-CM

## 2016-02-20 ENCOUNTER — Ambulatory Visit
Admission: RE | Admit: 2016-02-20 | Discharge: 2016-02-20 | Disposition: A | Discharge status: Home / Self Care | Payer: 59 | Source: Ambulatory Visit | Attending: Internal Medicine | Admitting: Internal Medicine

## 2016-02-20 DIAGNOSIS — R519 Headache, unspecified: Secondary | ICD-10-CM

## 2016-02-20 DIAGNOSIS — R51 Headache: Principal | ICD-10-CM

## 2016-02-20 MED ORDER — IOPAMIDOL (ISOVUE-300) INJECTION 61%
75.0000 mL | Freq: Once | INTRAVENOUS | Status: AC | PRN
  Administered 2016-02-20: 75 mL via INTRAVENOUS

## 2016-04-05 ENCOUNTER — Other Ambulatory Visit: Payer: Self-pay | Admitting: General Surgery

## 2016-04-06 ENCOUNTER — Encounter (HOSPITAL_COMMUNITY): Payer: Self-pay

## 2016-04-06 ENCOUNTER — Encounter (HOSPITAL_COMMUNITY)
Admission: RE | Admit: 2016-04-06 | Discharge: 2016-04-06 | Disposition: A | Discharge status: Home / Self Care | Payer: 59 | Source: Ambulatory Visit | Attending: General Surgery | Admitting: General Surgery

## 2016-04-06 DIAGNOSIS — Z79899 Other long term (current) drug therapy: Secondary | ICD-10-CM | POA: Diagnosis not present

## 2016-04-06 DIAGNOSIS — Z6838 Body mass index (BMI) 38.0-38.9, adult: Secondary | ICD-10-CM | POA: Diagnosis not present

## 2016-04-06 DIAGNOSIS — R51 Headache: Secondary | ICD-10-CM | POA: Diagnosis present

## 2016-04-06 DIAGNOSIS — F17200 Nicotine dependence, unspecified, uncomplicated: Secondary | ICD-10-CM | POA: Diagnosis not present

## 2016-04-06 DIAGNOSIS — I10 Essential (primary) hypertension: Secondary | ICD-10-CM | POA: Diagnosis not present

## 2016-04-06 DIAGNOSIS — K219 Gastro-esophageal reflux disease without esophagitis: Secondary | ICD-10-CM | POA: Diagnosis not present

## 2016-04-06 DIAGNOSIS — J449 Chronic obstructive pulmonary disease, unspecified: Secondary | ICD-10-CM | POA: Diagnosis not present

## 2016-04-06 HISTORY — DX: Personal history of colon polyps, unspecified: Z86.0100

## 2016-04-06 HISTORY — DX: Personal history of colonic polyps: Z86.010

## 2016-04-06 HISTORY — DX: Headache: R51

## 2016-04-06 HISTORY — DX: Headache, unspecified: R51.9

## 2016-04-06 HISTORY — DX: Endometriosis, unspecified: N80.9

## 2016-04-06 LAB — BASIC METABOLIC PANEL
Anion gap: 10 (ref 5–15)
BUN: 15 mg/dL (ref 6–20)
CALCIUM: 8.9 mg/dL (ref 8.9–10.3)
CHLORIDE: 99 mmol/L — AB (ref 101–111)
CO2: 27 mmol/L (ref 22–32)
CREATININE: 1.15 mg/dL — AB (ref 0.44–1.00)
GFR calc non Af Amer: 54 mL/min — ABNORMAL LOW (ref >60)
Glucose, Bld: 263 mg/dL — ABNORMAL HIGH (ref 65–99)
Potassium: 4.8 mmol/L (ref 3.5–5.1)
SODIUM: 136 mmol/L (ref 135–145)

## 2016-04-06 NOTE — Progress Notes (Signed)
04-06-16 1655 Note BMP- Glucose done today, none fasting labs-viewable in epic.

## 2016-04-06 NOTE — Patient Instructions (Addendum)
LAFONDA BAIRES  04/06/2016   Your procedure is scheduled on: 04-07-16   Report to [facility] Main  Entrance take [facility]  elevators to 3rd floor to  [place] at  0800 AM.  Call this number if you have problems the morning of surgery [phone]   Remember: ONLY 1 PERSON MAY GO WITH YOU TO SHORT STAY TO GET  READY MORNING OF [place].  Do not eat food or drink liquids :After Midnight.     Take these medicines the morning of surgery with A SIP OF WATER: Prednisone. Nexium. DO NOT TAKE ANY DIABETIC MEDICATIONS DAY OF YOUR SURGERY                               You may not have any metal on your body including hair pins and              piercings  Do not wear jewelry, make-up, lotions, powders or perfumes, deodorant             Do not wear nail polish.  Do not shave  48 hours prior to surgery.              Men may shave face and neck.   Do not bring valuables to the hospital. [place].  Contacts, dentures or bridgework may not be worn into surgery.  Leave suitcase in the car. After surgery it may be brought to your room.     Patients discharged the day of surgery will not be allowed to drive home.  Name and phone number of your driver:Fred Unk Lightning, father [phone] cell  Special Instructions: N/A              Please read over the following fact sheets you were given: _____________________________________________________________________             [facility] - Preparing for Surgery Before surgery, you can play an important role.  Because skin is not sterile, your skin needs to be as free of germs as possible.  You can reduce the number of germs on your skin by washing with CHG (chlorahexidine gluconate) soap before surgery.  CHG is an antiseptic cleaner which kills germs and bonds with the skin to continue killing germs even after washing. Please DO NOT use if you have an allergy to CHG or  antibacterial soaps.  If your skin becomes reddened/irritated stop using the CHG and inform your nurse when you arrive at Short Stay. Do not shave (including legs and underarms) for at least 48 hours prior to the first CHG shower.  You may shave your face/neck. Please follow these instructions carefully:  1.  Shower with CHG Soap the night before surgery and the  morning of Surgery.  2.  If you choose to wash your hair, wash your hair first as usual with your  normal  shampoo.  3.  After you shampoo, rinse your hair and body thoroughly to remove the  shampoo.                           4.  Use CHG as you would any other liquid soap.  You can apply chg directly  to the skin and wash                       Gently with a scrungie or clean washcloth.  5.  Apply the CHG Soap to your body ONLY FROM THE NECK DOWN.   Do not use on face/ open                           Wound or open sores. Avoid contact with eyes, ears mouth and genitals (private parts).                       Wash face,  Genitals (private parts) with your normal soap.             6.  Wash thoroughly, paying special attention to the area where your surgery  will be performed.  7.  Thoroughly rinse your body with warm water from the neck down.  8.  DO NOT shower/wash with your normal soap after using and rinsing off  the CHG Soap.                9.  Pat yourself dry with a clean towel.            10.  Wear clean pajamas.            11.  Place clean sheets on your bed the night of your first shower and do not  sleep with pets. Day of Surgery : Do not apply any lotions/deodorants the morning of surgery.  Please wear clean clothes to the hospital/surgery center.  FAILURE TO FOLLOW THESE INSTRUCTIONS MAY RESULT IN THE CANCELLATION OF YOUR SURGERY PATIENT SIGNATURE_________________________________  NURSE SIGNATURE__________________________________  ________________________________________________________________________

## 2016-04-06 NOTE — H&P (Signed)
Diamond [name] 04/05/2016 10:29 AM Location: [name] Patient #: [id number] DOB: 1966/05/12 Divorced / Language: Diamond [name] / Race: White Female   History of Present Illness Diamond Klein MD; 04/05/2016 11:32 AM) Patient words: new pt, rt temporal arteritis.  The patient is a 50 year old female who presents with a complaint of headache. Patient is a 50 year old female sent for consultation by Dr. Lavone [name] for evaluation for possible temporal arteritis. Patient started developing right-sided headaches several months ago. These did not feel like migraines. She had migraines in her 46s and did not feel the same. She describes as starting in the back her head and feeling like significant scalp sensitivity. This occurred for several weeks prior to her starting to get severe headaches. She says the headache starts on the right temple and then goes down into urinary neck and jaw and up onto the top of her head. She also became worried and she had a revision. She was sent for a neurologic evaluation of the headache clinic. He performs some rheumatologic studies which were not elevated and felt that she had chronic migraines. During part of this workup, she was started on a steroid taper. By the end of the day when she took the 60 mg of prednisone, her symptoms were gone. They stayed on for around a week and came back when her dose decreased. The headaches are her so severe that she has had them is working. She has had to take narcotics for this periodically. She has not had any recent chiropractic work done or massage. She has not had any change in her activity recently. She works at a desk job with Diamond [name].    Other Problems Diamond [name], Learned; 04/05/2016 10:29 AM) Back Pain High blood pressure Migraine Headache Oophorectomy  Past Surgical History Diamond [name], [name]; 04/05/2016 10:29 AM) Colon Polyp Removal - Colonoscopy Hysterectomy (due to cancer) -  Complete Hysterectomy (not due to cancer) - Complete Oral Surgery Tonsillectomy  Diagnostic Studies History Diamond [name], [name]; 04/05/2016 10:29 AM) Colonoscopy 1-5 years ago Mammogram within last year Pap Smear 1-5 years ago  Allergies Diamond [name], [name]; 04/05/2016 10:31 AM) No Known Drug Allergies07/17/2017  Medication History Diamond [name], [name]; 04/05/2016 10:35 AM) Hydrocodone-Acetaminophen (5-325MG  Tablet, Oral as needed) Active. PredniSONE (20MG  Tablet, Oral two times daily) Active. Estradiol (2MG  Tablet, Oral) Active. HydroCHLOROthiazide (25MG  Tablet, Oral) Active. Omeprazole (40MG  Capsule DR, Oral) Active. ROPINIRole HCl (1MG  Tablet, Oral) Active. Zolpidem Tartrate (5MG  Tablet, Oral) Active. Potassium Chloride (20MEQ Tablet ER, Oral) Active. Furosemide (20MG  Tablet, Oral) Active. Biotin (1MG  Capsule, Oral) Active. Cinnamon (500MG  Tablet, Oral) Active. Multi-Day (Oral) Active. Cod Liver Oil (Oral) Specific dose unknown - Active. Garlic (Oral) Specific dose unknown - Active. Flax Seed Oil (1000MG  Capsule, Oral) Active. Medications Reconciled  Social History Diamond [name], [name]; 04/05/2016 10:29 AM) Alcohol use Occasional alcohol use. Caffeine use Carbonated beverages, Tea. No drug use Tobacco use Current some day smoker.  Family History Diamond [name], [name]; 04/05/2016 10:29 AM) Cerebrovascular Accident Brother. Colon Polyps Father, Mother. Hypertension Father, Mother.  Pregnancy / Birth History Diamond [name], [name]; 04/05/2016 10:29 AM) Age at menarche 48 years. Contraceptive History Oral contraceptives. Gravida 2 Length (months) of breastfeeding 3-6 Maternal age 66-25 Para 2    Review of Systems Diamond [name] [name]; 04/05/2016 10:29 AM) General Present- Fatigue, Night Sweats and Weight Gain. Not Present- Appetite Loss, Chills, Fever and Weight Loss. Skin Not Present- Change in Wart/Mole, Dryness, Hives, Jaundice, New  Lesions, Non-Healing Wounds, Rash and Ulcer.  HEENT Present- Earache, Ringing in the Ears, Sore Throat, Visual Disturbances and Wears glasses/contact lenses. Not Present- Hearing Loss, Hoarseness, Nose Bleed, Oral Ulcers, Seasonal Allergies, Sinus Pain and Yellow Eyes. Respiratory Present- Snoring. Not Present- Bloody sputum, Chronic Cough, Difficulty Breathing and Wheezing. Breast Not Present- Breast Mass, Breast Pain, Nipple Discharge and Skin Changes. Cardiovascular Present- Difficulty Breathing Lying Down, Leg Cramps, Rapid Heart Rate and Swelling of Extremities. Not Present- Chest Pain, Palpitations and Shortness of Breath. Gastrointestinal Present- Bloating. Not Present- Abdominal Pain, Bloody Stool, Change in Bowel Habits, Chronic diarrhea, Constipation, Difficulty Swallowing, Excessive gas, Gets full quickly at meals, Hemorrhoids, Indigestion, Nausea, Rectal Pain and Vomiting. Female Genitourinary Present- Frequency and Urgency. Not Present- Nocturia, Painful Urination and Pelvic Pain. Neurological Present- Headaches. Not Present- Decreased Memory, Fainting, Numbness, Seizures, Tingling, Tremor, Trouble walking and Weakness. Psychiatric Present- Anxiety and Change in Sleep Pattern. Not Present- Bipolar, Depression, Fearful and Frequent crying. Endocrine Not Present- Cold Intolerance, Excessive Hunger, Hair Changes, Heat Intolerance, Hot flashes and New Diabetes. Hematology Not Present- Blood Thinners, Easy Bruising, Excessive bleeding, Gland problems, HIV and Persistent Infections.  Vitals Diamond [name] [name]; 04/05/2016 10:35 AM) 04/05/2016 10:35 AM Weight: 214.5 lb Height: 63.5in Height was reported by patient. Body Surface Area: 2 m Body Mass Index: 37.4 kg/m  Temp.: 98.58F(Oral)  Pulse: 78 (Regular)  BP: 128/80 (Sitting, Left Arm, Standard)       Physical Exam Diamond Klein MD; 04/05/2016 11:33 AM) General Mental Status-Alert. General Appearance-Consistent with  stated age. Hydration-Well hydrated. Voice-Normal.  Head and Neck Head-normocephalic, atraumatic with no lesions or palpable masses. Trachea-midline. Thyroid Gland Characteristics - normal size and consistency. Note: difficult to palpate right temporal artery.   Eye Eyeball - Bilateral-Extraocular movements intact. Sclera/Conjunctiva - Bilateral-No scleral icterus.  Chest and Lung Exam Chest and lung exam reveals -quiet, even and easy respiratory effort with no use of accessory muscles and on auscultation, normal breath sounds, no adventitious sounds and normal vocal resonance. Inspection Chest Wall - Normal. Back - normal.  Cardiovascular Cardiovascular examination reveals -normal heart sounds, regular rate and rhythm with no murmurs and normal pedal pulses bilaterally.  Abdomen Inspection Inspection of the abdomen reveals - No Hernias. Palpation/Percussion Palpation and Percussion of the abdomen reveal - Soft, Non Tender, No Rebound tenderness, No Rigidity (guarding) and No hepatosplenomegaly. Auscultation Auscultation of the abdomen reveals - Bowel sounds normal.  Neurologic Neurologic evaluation reveals -alert and oriented x 3 with no impairment of recent or remote memory. Mental Status-Normal.  Musculoskeletal Global Assessment -Note: no gross deformities.  Normal Exam - Left-Upper Extremity Strength Normal and Lower Extremity Strength Normal. Normal Exam - Right-Upper Extremity Strength Normal and Lower Extremity Strength Normal.  Lymphatic Head & Neck  General Head & Neck Lymphatics: Bilateral - Description - Normal. Axillary  General Axillary Region: Bilateral - Description - Normal. Tenderness - Non Tender. Femoral & Inguinal  Generalized Femoral & Inguinal Lymphatics: Bilateral - Description - No Generalized lymphadenopathy.    Assessment & Plan Diamond Klein MD; 04/05/2016 11:35 AM) CHRONIC RIGHT-SIDED HEADACHES  (R51) Impression: Symtoms are concerning for possible temporal arteritis.  Will plan right temporal artery biopsy.  Reviewed risks and benefits with the patient. Discussed the risk of nerve injury.  Patient does want to be completely asleep for surgery. Current Plans You are being scheduled for surgery - Our schedulers will call you.  You should hear from our office's scheduling department within 5 working days about the location, date, and time of surgery. We try to make  accommodations for patient's preferences in scheduling surgery, but sometimes the OR schedule or the surgeon's schedule prevents [place] from making those accommodations.  If you have not heard from our office [phone]) in 5 working days, call the office and ask for your surgeon's nurse.  If you have other questions about your diagnosis, plan, or surgery, call the office and ask for your surgeon's nurse.  Pt Education - [facility] Free Text Education/Instructions: discussed with patient and provided information.   Signed by Diamond Klein, MD (04/05/2016 11:35 AM)

## 2016-04-06 NOTE — Pre-Procedure Instructions (Signed)
04-06-16 labs done 03-19-16 CBC/d,TSH,T4, Sed rate with chart. EKG 6'17 with chart.

## 2016-04-07 ENCOUNTER — Ambulatory Visit (HOSPITAL_COMMUNITY)
Admission: RE | Admit: 2016-04-07 | Discharge: 2016-04-07 | Disposition: A | Discharge status: Home / Self Care | Payer: 59 | Source: Ambulatory Visit | Attending: General Surgery | Admitting: General Surgery

## 2016-04-07 ENCOUNTER — Ambulatory Visit (HOSPITAL_COMMUNITY): Payer: 59 | Admitting: Certified Registered Nurse Anesthetist

## 2016-04-07 ENCOUNTER — Encounter (HOSPITAL_COMMUNITY)
Admission: RE | Disposition: A | Discharge status: Home / Self Care | Payer: Self-pay | Source: Ambulatory Visit | Attending: General Surgery

## 2016-04-07 ENCOUNTER — Encounter (HOSPITAL_COMMUNITY): Payer: Self-pay | Admitting: *Deleted

## 2016-04-07 DIAGNOSIS — I10 Essential (primary) hypertension: Secondary | ICD-10-CM | POA: Insufficient documentation

## 2016-04-07 DIAGNOSIS — K219 Gastro-esophageal reflux disease without esophagitis: Secondary | ICD-10-CM | POA: Insufficient documentation

## 2016-04-07 DIAGNOSIS — Z6838 Body mass index (BMI) 38.0-38.9, adult: Secondary | ICD-10-CM | POA: Insufficient documentation

## 2016-04-07 DIAGNOSIS — R51 Headache: Secondary | ICD-10-CM | POA: Diagnosis not present

## 2016-04-07 DIAGNOSIS — F17200 Nicotine dependence, unspecified, uncomplicated: Secondary | ICD-10-CM | POA: Insufficient documentation

## 2016-04-07 DIAGNOSIS — Z79899 Other long term (current) drug therapy: Secondary | ICD-10-CM | POA: Insufficient documentation

## 2016-04-07 DIAGNOSIS — J449 Chronic obstructive pulmonary disease, unspecified: Secondary | ICD-10-CM | POA: Insufficient documentation

## 2016-04-07 HISTORY — PX: ARTERY BIOPSY: SHX891

## 2016-04-07 SURGERY — BIOPSY TEMPORAL ARTERY
Anesthesia: General | Laterality: Right

## 2016-04-07 MED ORDER — SODIUM CHLORIDE 0.9% FLUSH: 3.0000 mL | INTRAVENOUS | Status: DC | PRN

## 2016-04-07 MED ORDER — OXYCODONE HCL 5 MG PO TABS
5.0000 mg | ORAL_TABLET | ORAL | Status: DC | PRN
  Administered 2016-04-07 (×2): 5 mg via ORAL
  Filled 2016-04-07 (×2): qty 1

## 2016-04-07 MED ORDER — FENTANYL CITRATE (PF) 100 MCG/2ML IJ SOLN
INTRAMUSCULAR | Status: DC | PRN
  Administered 2016-04-07 (×2): 50 ug via INTRAVENOUS

## 2016-04-07 MED ORDER — SUCCINYLCHOLINE CHLORIDE 20 MG/ML IJ SOLN
INTRAMUSCULAR | Status: DC | PRN
  Administered 2016-04-07: 100 mg via INTRAVENOUS

## 2016-04-07 MED ORDER — LIDOCAINE HCL (PF) 1 % IJ SOLN
INTRAMUSCULAR | Status: DC | PRN
  Administered 2016-04-07: .5 mL

## 2016-04-07 MED ORDER — GLYCOPYRROLATE 0.2 MG/ML IJ SOLN
INTRAMUSCULAR | Status: DC | PRN
  Administered 2016-04-07: 0.2 mg via INTRAVENOUS

## 2016-04-07 MED ORDER — CEFAZOLIN SODIUM-DEXTROSE 2-4 GM/100ML-% IV SOLN
INTRAVENOUS | Status: AC
  Filled 2016-04-07: qty 100

## 2016-04-07 MED ORDER — PROPOFOL 10 MG/ML IV BOLUS
INTRAVENOUS | Status: DC | PRN
  Administered 2016-04-07: 160 mg via INTRAVENOUS

## 2016-04-07 MED ORDER — PROPOFOL 10 MG/ML IV BOLUS
INTRAVENOUS | Status: AC
  Filled 2016-04-07: qty 20

## 2016-04-07 MED ORDER — BUPIVACAINE-EPINEPHRINE 0.25% -1:200000 IJ SOLN
INTRAMUSCULAR | Status: DC | PRN
  Administered 2016-04-07: .5 mL

## 2016-04-07 MED ORDER — FENTANYL CITRATE (PF) 100 MCG/2ML IJ SOLN
INTRAMUSCULAR | Status: AC
  Filled 2016-04-07: qty 2

## 2016-04-07 MED ORDER — METOCLOPRAMIDE HCL 5 MG/ML IJ SOLN
INTRAMUSCULAR | Status: DC | PRN
  Administered 2016-04-07: 5 mg via INTRAVENOUS

## 2016-04-07 MED ORDER — PHENYLEPHRINE HCL 10 MG/ML IJ SOLN
INTRAMUSCULAR | Status: DC | PRN
  Administered 2016-04-07 (×3): 40 ug via INTRAVENOUS

## 2016-04-07 MED ORDER — ACETAMINOPHEN 325 MG PO TABS: 650.0000 mg | ORAL_TABLET | ORAL | Status: DC | PRN

## 2016-04-07 MED ORDER — FENTANYL CITRATE (PF) 100 MCG/2ML IJ SOLN
25.0000 ug | INTRAMUSCULAR | Status: DC | PRN
  Administered 2016-04-07 (×3): 50 ug via INTRAVENOUS

## 2016-04-07 MED ORDER — PROMETHAZINE HCL 25 MG/ML IJ SOLN: 6.2500 mg | INTRAMUSCULAR | Status: DC | PRN

## 2016-04-07 MED ORDER — LACTATED RINGERS IV SOLN
INTRAVENOUS | Status: DC
  Administered 2016-04-07: 11:00:00 via INTRAVENOUS
  Administered 2016-04-07: 1000 mL via INTRAVENOUS

## 2016-04-07 MED ORDER — SODIUM CHLORIDE 0.9% FLUSH: 3.0000 mL | Freq: Two times a day (BID) | INTRAVENOUS | Status: DC

## 2016-04-07 MED ORDER — LIDOCAINE HCL (CARDIAC) 20 MG/ML IV SOLN
INTRAVENOUS | Status: DC | PRN
  Administered 2016-04-07: 50 mg via INTRAVENOUS

## 2016-04-07 MED ORDER — EPHEDRINE SULFATE 50 MG/ML IJ SOLN
INTRAMUSCULAR | Status: AC
  Filled 2016-04-07: qty 1

## 2016-04-07 MED ORDER — ONDANSETRON HCL 4 MG/2ML IJ SOLN
INTRAMUSCULAR | Status: AC
  Filled 2016-04-07: qty 2

## 2016-04-07 MED ORDER — BUPIVACAINE-EPINEPHRINE 0.25% -1:200000 IJ SOLN
INTRAMUSCULAR | Status: AC
  Filled 2016-04-07: qty 1

## 2016-04-07 MED ORDER — EPHEDRINE SULFATE 50 MG/ML IJ SOLN
INTRAMUSCULAR | Status: DC | PRN
  Administered 2016-04-07: 5 mg via INTRAVENOUS
  Administered 2016-04-07: 10 mg via INTRAVENOUS

## 2016-04-07 MED ORDER — 0.9 % SODIUM CHLORIDE (POUR BTL) OPTIME
TOPICAL | Status: DC | PRN
  Administered 2016-04-07: 1000 mL

## 2016-04-07 MED ORDER — SODIUM CHLORIDE 0.9 % IJ SOLN
INTRAMUSCULAR | Status: AC
  Filled 2016-04-07: qty 10

## 2016-04-07 MED ORDER — MIDAZOLAM HCL 5 MG/5ML IJ SOLN
INTRAMUSCULAR | Status: DC | PRN
  Administered 2016-04-07: 2 mg via INTRAVENOUS

## 2016-04-07 MED ORDER — SODIUM CHLORIDE 0.9 % IV SOLN: 250.0000 mL | INTRAVENOUS | Status: DC | PRN

## 2016-04-07 MED ORDER — MIDAZOLAM HCL 2 MG/2ML IJ SOLN
INTRAMUSCULAR | Status: AC
  Filled 2016-04-07: qty 2

## 2016-04-07 MED ORDER — ACETAMINOPHEN 650 MG RE SUPP
650.0000 mg | RECTAL | Status: DC | PRN
  Filled 2016-04-07: qty 1

## 2016-04-07 MED ORDER — ONDANSETRON HCL 4 MG/2ML IJ SOLN
INTRAMUSCULAR | Status: DC | PRN
  Administered 2016-04-07: 4 mg via INTRAVENOUS

## 2016-04-07 MED ORDER — LIDOCAINE HCL 1 % IJ SOLN
INTRAMUSCULAR | Status: AC
  Filled 2016-04-07: qty 20

## 2016-04-07 MED ORDER — HYDROCODONE-ACETAMINOPHEN 5-325 MG PO TABS: 1.0000 | ORAL_TABLET | Freq: Four times a day (QID) | ORAL | Status: DC | PRN

## 2016-04-07 MED ORDER — MEPERIDINE HCL 50 MG/ML IJ SOLN: 6.2500 mg | INTRAMUSCULAR | Status: DC | PRN

## 2016-04-07 MED ORDER — MIDAZOLAM HCL 2 MG/2ML IJ SOLN: 0.5000 mg | Freq: Once | INTRAMUSCULAR | Status: DC | PRN

## 2016-04-07 MED ORDER — LIDOCAINE HCL (CARDIAC) 20 MG/ML IV SOLN
INTRAVENOUS | Status: AC
  Filled 2016-04-07: qty 5

## 2016-04-07 MED ORDER — FENTANYL CITRATE (PF) 100 MCG/2ML IJ SOLN
INTRAMUSCULAR | Status: AC
  Administered 2016-04-07: 50 ug via INTRAVENOUS
  Filled 2016-04-07: qty 2

## 2016-04-07 MED ORDER — CEFAZOLIN SODIUM-DEXTROSE 2-4 GM/100ML-% IV SOLN
2.0000 g | INTRAVENOUS | Status: AC
  Administered 2016-04-07: 2 g via INTRAVENOUS

## 2016-04-07 SURGICAL SUPPLY — 42 items
BANDAGE ACE 6X5 VEL STRL LF (GAUZE/BANDAGES/DRESSINGS) IMPLANT
BLADE SURG 15 STRL LF DISP TIS (BLADE) ×3 IMPLANT
BLADE SURG 15 STRL SS (BLADE) ×6
CLEANER TIP ELECTROSURG 2X2 (MISCELLANEOUS) ×2 IMPLANT
COVER SURGICAL LIGHT HANDLE (MISCELLANEOUS) IMPLANT
DECANTER SPIKE VIAL GLASS SM (MISCELLANEOUS) ×2 IMPLANT
DERMABOND ADVANCED (GAUZE/BANDAGES/DRESSINGS) ×1
DERMABOND ADVANCED .7 DNX12 (GAUZE/BANDAGES/DRESSINGS) ×1 IMPLANT
DRAPE LAPAROTOMY TRNSV 102X78 (DRAPE) ×2 IMPLANT
ELECT COATED BLADE 2.86 ST (ELECTRODE) ×2 IMPLANT
ELECT PENCIL ROCKER SW 15FT (MISCELLANEOUS) ×2 IMPLANT
ELECT REM PT RETURN 9FT ADLT (ELECTROSURGICAL) ×2
ELECTRODE REM PT RTRN 9FT ADLT (ELECTROSURGICAL) ×1 IMPLANT
GAUZE SPONGE 4X4 12PLY STRL (GAUZE/BANDAGES/DRESSINGS) IMPLANT
GAUZE SPONGE 4X4 16PLY XRAY LF (GAUZE/BANDAGES/DRESSINGS) ×2 IMPLANT
GLOVE BIO SURGEON STRL SZ 6 (GLOVE) ×2 IMPLANT
GLOVE BIO SURGEON STRL SZ7 (GLOVE) ×4 IMPLANT
GLOVE BIOGEL PI IND STRL 6.5 (GLOVE) ×1 IMPLANT
GLOVE BIOGEL PI IND STRL 7.0 (GLOVE) ×1 IMPLANT
GLOVE BIOGEL PI INDICATOR 6.5 (GLOVE) ×1
GLOVE BIOGEL PI INDICATOR 7.0 (GLOVE) ×1
GLOVE INDICATOR 6.5 STRL GRN (GLOVE) ×2 IMPLANT
GOWN STRL REUS W/ TWL XL LVL3 (GOWN DISPOSABLE) ×3 IMPLANT
GOWN STRL REUS W/TWL 2XL LVL3 (GOWN DISPOSABLE) ×2 IMPLANT
GOWN STRL REUS W/TWL XL LVL3 (GOWN DISPOSABLE) ×6
KIT BASIN OR (CUSTOM PROCEDURE TRAY) ×2 IMPLANT
LIQUID BAND (GAUZE/BANDAGES/DRESSINGS) IMPLANT
MARKER SKIN DUAL TIP RULER LAB (MISCELLANEOUS) ×2 IMPLANT
NEEDLE HYPO 22GX1.5 SAFETY (NEEDLE) ×2 IMPLANT
NEEDLE HYPO 25X1 1.5 SAFETY (NEEDLE) ×2 IMPLANT
PACK BASIC VI WITH GOWN DISP (CUSTOM PROCEDURE TRAY) ×2 IMPLANT
SOL PREP POV-IOD 4OZ 10% (MISCELLANEOUS) ×2 IMPLANT
SPONGE LAP 4X18 X RAY DECT (DISPOSABLE) IMPLANT
STRIP CLOSURE SKIN 1/2X4 (GAUZE/BANDAGES/DRESSINGS) ×4 IMPLANT
SUT MNCRL AB 4-0 PS2 18 (SUTURE) ×2 IMPLANT
SUT VIC AB 3-0 SH 27 (SUTURE) ×3
SUT VIC AB 3-0 SH 27X BRD (SUTURE) ×1 IMPLANT
SUT VIC AB 3-0 SH 27XBRD (SUTURE) ×1 IMPLANT
SYR BULB IRRIGATION 50ML (SYRINGE) ×2 IMPLANT
SYR CONTROL 10ML LL (SYRINGE) ×2 IMPLANT
TOWEL OR 17X26 10 PK STRL BLUE (TOWEL DISPOSABLE) ×2 IMPLANT
YANKAUER SUCT BULB TIP 10FT TU (MISCELLANEOUS) ×2 IMPLANT

## 2016-04-07 NOTE — Anesthesia Procedure Notes (Signed)
Procedure Name: Intubation Date/Time: 04/07/2016 9:57 AM Performed by: West Pugh Pre-anesthesia Checklist: Patient identified, Emergency Drugs available, Suction available, Patient being monitored and Timeout performed Patient Re-evaluated:Patient Re-evaluated prior to inductionOxygen Delivery Method: Circle system utilized Preoxygenation: Pre-oxygenation with 100% oxygen Intubation Type: IV induction, Cricoid Pressure applied and Rapid sequence Ventilation: Mask ventilation without difficulty Laryngoscope Size: Mac and 4 Grade View: Grade I Tube type: Oral Number of attempts: 1 Airway Equipment and Method: Stylet Placement Confirmation: ETT inserted through vocal cords under direct vision,  positive ETCO2,  CO2 detector and breath sounds checked- equal and bilateral Tube secured with: Tape Dental Injury: Teeth and Oropharynx as per pre-operative assessment

## 2016-04-07 NOTE — Transfer of Care (Signed)
Immediate Anesthesia Transfer of Care Note  Patient: Diamond [name]  Procedure(s) Performed: Procedure(s): BIOPSY TEMPORAL ARTERY RIGHT (Right)  Patient Location: PACU  Anesthesia Type:General  Level of Consciousness:  sedated, patient cooperative and responds to stimulation  Airway & Oxygen Therapy:Patient Spontanous Breathing and Patient connected to face mask oxgen  Post-op Assessment:  Report given to PACU RN and Post -op Vital signs reviewed and stable  Post vital signs:  Reviewed and stable  Last Vitals:  Filed Vitals:   04/07/16 0758 04/07/16 1115  BP: 158/74 166/105  Pulse: 83 101  Temp: 36.9 C 36.8 C  Resp: 18 22    Complications: No apparent anesthesia complications

## 2016-04-07 NOTE — Anesthesia Postprocedure Evaluation (Signed)
Anesthesia Post Note  Patient: Diamond [name]  Procedure(s) Performed: Procedure(s) (LRB): BIOPSY TEMPORAL ARTERY RIGHT (Right)  Patient location during evaluation: PACU Anesthesia Type: General Level of consciousness: awake and alert, oriented and patient cooperative Pain management: pain level controlled Vital Signs Assessment: post-procedure vital signs reviewed and stable Respiratory status: spontaneous breathing, nonlabored ventilation and respiratory function stable Cardiovascular status: blood pressure returned to baseline and stable Postop Assessment: no signs of nausea or vomiting Anesthetic complications: no    Last Vitals:  Filed Vitals:   04/07/16 1115 04/07/16 1130  BP: 166/105 142/73  Pulse: 101 89  Temp: 36.8 C   Resp: 22 19    Last Pain:  Filed Vitals:   04/07/16 1134  PainSc: 4                  Ules Marsala,E. Emagene Merfeld

## 2016-04-07 NOTE — Discharge Instructions (Signed)
[facility] Office Phone Number [phone]   POST OP INSTRUCTIONS  Always review your discharge instruction sheet given to you by the facility where your surgery was performed.  IF YOU HAVE DISABILITY OR FAMILY LEAVE FORMS, YOU MUST BRING THEM TO THE OFFICE FOR PROCESSING.  DO NOT GIVE THEM TO YOUR DOCTOR.  1. A prescription for pain medication may be given to you upon discharge.  Take your pain medication as prescribed, if needed.  If narcotic pain medicine is not needed, then you may take acetaminophen (Tylenol) or ibuprofen (Advil) as needed. 2. Take your usually prescribed medications unless otherwise directed 3. If you need a refill on your pain medication, please contact your pharmacy.  They will contact our office to request authorization.  Prescriptions will not be filled after 5pm or on week-ends. 4. You should eat very light the first 24 hours after surgery, such as soup, crackers, pudding, etc.  Resume your normal diet the day after surgery 5. It is common to experience some constipation if taking pain medication after surgery.  Increasing fluid intake and taking a stool softener will usually help or prevent this problem from occurring.  A mild laxative (Milk of Magnesia or Miralax) should be taken according to package directions if there are no bowel movements after 48 hours. 6. You may shower in 48 hours.  The surgical glue will flake off in 2-3 weeks.   7. ACTIVITIES:  No strenuous activity or heavy lifting for 1 week.   a. You may drive when you no longer are taking prescription pain medication, you can comfortably wear a seatbelt, and you can safely maneuver your car and apply brakes. b. RETURN TO WORK:  __________as tolerated as long as no lifting for 1 week_______________ You should see your doctor in the office for a follow-up appointment approximately three-four weeks after your surgery.    WHEN TO CALL YOUR DOCTOR: 1. Fever over 101.0 2. Nausea and/or  vomiting. 3. Extreme swelling or bruising. 4. Continued bleeding from incision. 5. Increased pain, redness, or drainage from the incision.  The clinic staff is available to answer your questions during regular business hours.  Please don't hesitate to call and ask to speak to one of the nurses for clinical concerns.  If you have a medical emergency, go to the nearest emergency room or call 911.  A surgeon from [facility] is always on call at the hospital.  For further questions, please visit [email or web address]   

## 2016-04-07 NOTE — Op Note (Signed)
PRE-OPERATIVE DIAGNOSIS: headaches, visual changes, concern for temporal arteritis  POST-OPERATIVE DIAGNOSIS:  Same  PROCEDURE:  Procedure(s): Right temporal artery biopsy  SURGEON:  Surgeon(s): [name], MD  ASSISTANT:   Sharin Grave, RNFA  ANESTHESIA:   General and local.  FINDINGS:  Small temporal artery  LOCAL MEDICATIONS USED:  MARCAINE    and XYLOCAINE   SPECIMEN:  Source of Specimen:  right temporal artery segment  DISPOSITION OF SPECIMEN:  PATHOLOGY  COUNTS:  YES  PLAN OF CARE: Discharge to home after PACU  PATIENT DISPOSITION:  PACU - hemodynamically stable.   PROCEDURE:   The patient was identified in the holding area, taken to the operating room, and was placed supine on the operating room table.  General anesthesia was induced.  Since the right side was the symptomatic side, the right temporal artery was traced out and marked.  .    The hair was clipped and the lateral upper face was prepped and draped in sterile fashion.  Timeout was performed according to the surgical safety checklist. When all was correct, we continued.  Local anesthetic was infiltrated in the skin above the temporal artery.  A longitudinal incision proximally 3 cm was made with the #15 blade.  The temporal artery was skeletonized with the small right angle clamp. Care was taken to avoid the branches of the facial nerve. Side branches of the temporal artery were ligated with 4-0 silk ties  A 2 cm segment of the artery was taken. The main temporal artery was ligated with 3-0 silk ties.  Hemostasis was achieved with cautery.    The incision was closed with 3-0 vicryl interrupted deep dermal sutures and 4-0 monocryl running subcuticular suture.  The wound was cleaned, dried, and dressed with dermabond.    Needle, sponge, and instrument counts were correct.  Pt emerged from anesthesia and was taken to PACU in stable condition.

## 2016-04-07 NOTE — Anesthesia Preprocedure Evaluation (Addendum)
Anesthesia Evaluation  Patient identified by MRN, date of birth, ID band Patient awake    Reviewed: Allergy & Precautions, NPO status , Patient's Chart, lab work & pertinent test results  History of Anesthesia Complications Negative for: history of anesthetic complications  Airway Mallampati: I  TM Distance: >3 FB Neck ROM: Full    Dental  (+) Dental Advisory Given, Teeth Intact   Pulmonary COPD, Current Smoker,    breath sounds clear to auscultation       Cardiovascular hypertension, Pt. on medications  Rhythm:Regular Rate:Normal     Neuro/Psych  Headaches,    GI/Hepatic Neg liver ROS, GERD  Medicated and Poorly Controlled,  Endo/Other  Morbid obesity  Renal/GU negative Renal ROS     Musculoskeletal   Abdominal (+) + obese,   Peds  Hematology negative hematology ROS (+)   Anesthesia Other Findings   Reproductive/Obstetrics                            Anesthesia Physical Anesthesia Plan  ASA: II  Anesthesia Plan: General   Post-op Pain Management:    Induction: Intravenous  Airway Management Planned: Oral ETT  Additional Equipment:   Intra-op Plan:   Post-operative Plan: Extubation in OR  Informed Consent: I have reviewed the patients History and Physical, chart, labs and discussed the procedure including the risks, benefits and alternatives for the proposed anesthesia with the patient or authorized representative who has indicated his/her understanding and acceptance.   Dental advisory given  Plan Discussed with: CRNA and Surgeon  Anesthesia Plan Comments: (Plan routine monitors, GETA Pt strongly prefers GA)        Anesthesia Quick Evaluation

## 2016-04-08 ENCOUNTER — Encounter (HOSPITAL_COMMUNITY): Payer: Self-pay | Admitting: General Surgery

## 2016-04-09 NOTE — Progress Notes (Signed)
Quick Note:  Please let patient know that biopsy was negative for temporal arteritis. ______

## 2016-05-30 ENCOUNTER — Observation Stay (HOSPITAL_COMMUNITY): Payer: 59

## 2016-05-30 ENCOUNTER — Inpatient Hospital Stay (HOSPITAL_COMMUNITY)
Admission: EM | Admit: 2016-05-30 | Discharge: 2016-06-03 | DRG: 440 | Disposition: A | Discharge status: Home / Self Care | Payer: 59 | Attending: Family Medicine | Admitting: Family Medicine

## 2016-05-30 ENCOUNTER — Encounter (HOSPITAL_COMMUNITY): Payer: Self-pay | Admitting: Emergency Medicine

## 2016-05-30 ENCOUNTER — Emergency Department (HOSPITAL_COMMUNITY): Payer: 59

## 2016-05-30 DIAGNOSIS — E781 Pure hyperglyceridemia: Secondary | ICD-10-CM | POA: Diagnosis present

## 2016-05-30 DIAGNOSIS — E785 Hyperlipidemia, unspecified: Secondary | ICD-10-CM | POA: Diagnosis present

## 2016-05-30 DIAGNOSIS — G2581 Restless legs syndrome: Secondary | ICD-10-CM | POA: Diagnosis present

## 2016-05-30 DIAGNOSIS — K859 Acute pancreatitis without necrosis or infection, unspecified: Principal | ICD-10-CM | POA: Diagnosis present

## 2016-05-30 DIAGNOSIS — Z86010 Personal history of colonic polyps: Secondary | ICD-10-CM

## 2016-05-30 DIAGNOSIS — R52 Pain, unspecified: Secondary | ICD-10-CM

## 2016-05-30 DIAGNOSIS — I10 Essential (primary) hypertension: Secondary | ICD-10-CM | POA: Diagnosis present

## 2016-05-30 DIAGNOSIS — F17210 Nicotine dependence, cigarettes, uncomplicated: Secondary | ICD-10-CM | POA: Diagnosis present

## 2016-05-30 DIAGNOSIS — Z6836 Body mass index (BMI) 36.0-36.9, adult: Secondary | ICD-10-CM

## 2016-05-30 DIAGNOSIS — K858 Other acute pancreatitis without necrosis or infection: Secondary | ICD-10-CM | POA: Diagnosis not present

## 2016-05-30 DIAGNOSIS — K589 Irritable bowel syndrome without diarrhea: Secondary | ICD-10-CM | POA: Diagnosis not present

## 2016-05-30 DIAGNOSIS — K219 Gastro-esophageal reflux disease without esophagitis: Secondary | ICD-10-CM | POA: Diagnosis present

## 2016-05-30 DIAGNOSIS — E669 Obesity, unspecified: Secondary | ICD-10-CM | POA: Diagnosis present

## 2016-05-30 DIAGNOSIS — R109 Unspecified abdominal pain: Secondary | ICD-10-CM | POA: Diagnosis not present

## 2016-05-30 DIAGNOSIS — Z833 Family history of diabetes mellitus: Secondary | ICD-10-CM

## 2016-05-30 DIAGNOSIS — Z79899 Other long term (current) drug therapy: Secondary | ICD-10-CM

## 2016-05-30 DIAGNOSIS — E876 Hypokalemia: Secondary | ICD-10-CM | POA: Diagnosis present

## 2016-05-30 LAB — COMPREHENSIVE METABOLIC PANEL
ALK PHOS: 45 U/L (ref 38–126)
ALT: 35 U/L (ref 14–54)
ANION GAP: 19 — AB (ref 5–15)
AST: 63 U/L — ABNORMAL HIGH (ref 15–41)
Albumin: 3.2 g/dL — ABNORMAL LOW (ref 3.5–5.0)
BILIRUBIN TOTAL: 1.3 mg/dL — AB (ref 0.3–1.2)
BUN: <5 mg/dL — ABNORMAL LOW (ref 6–20)
CALCIUM: 8.9 mg/dL (ref 8.9–10.3)
CO2: 18 mmol/L — ABNORMAL LOW (ref 22–32)
Chloride: 99 mmol/L — ABNORMAL LOW (ref 101–111)
Creatinine, Ser: 0.65 mg/dL (ref 0.44–1.00)
GFR calc non Af Amer: >60 mL/min (ref >60)
GLUCOSE: 161 mg/dL — AB (ref 65–99)
Potassium: 4.4 mmol/L (ref 3.5–5.1)
Sodium: 136 mmol/L (ref 135–145)
TOTAL PROTEIN: 6.5 g/dL (ref 6.5–8.1)

## 2016-05-30 LAB — URINALYSIS, ROUTINE W REFLEX MICROSCOPIC
BILIRUBIN URINE: NEGATIVE
Glucose, UA: NEGATIVE mg/dL
HGB URINE DIPSTICK: NEGATIVE
Leukocytes, UA: NEGATIVE
NITRITE: NEGATIVE
PH: 7 (ref 5.0–8.0)
Protein, ur: NEGATIVE mg/dL
Specific Gravity, Urine: 1.024 (ref 1.005–1.030)

## 2016-05-30 LAB — CBC
HEMATOCRIT: 43.7 % (ref 36.0–46.0)
Hemoglobin: 15.5 g/dL — ABNORMAL HIGH (ref 12.0–15.0)
MCH: 30.9 pg (ref 26.0–34.0)
MCHC: 35.5 g/dL (ref 30.0–36.0)
MCV: 87.2 fL (ref 78.0–100.0)
Platelets: 231 10*3/uL (ref 150–400)
RBC: 5.01 MIL/uL (ref 3.87–5.11)
RDW: 14.6 % (ref 11.5–15.5)
WBC: 12.7 10*3/uL — ABNORMAL HIGH (ref 4.0–10.5)

## 2016-05-30 LAB — I-STAT TROPONIN, ED: Troponin i, poc: 0 ng/mL (ref 0.00–0.08)

## 2016-05-30 LAB — GLUCOSE, CAPILLARY: Glucose-Capillary: 184 mg/dL — ABNORMAL HIGH (ref 65–99)

## 2016-05-30 LAB — LIPASE, BLOOD: Lipase: 396 U/L — ABNORMAL HIGH (ref 11–51)

## 2016-05-30 LAB — TRIGLYCERIDES: Triglycerides: 3153 mg/dL — ABNORMAL HIGH (ref <150)

## 2016-05-30 MED ORDER — SODIUM CHLORIDE 0.9 % IV SOLN
INTRAVENOUS | Status: AC
  Administered 2016-05-30 (×2): via INTRAVENOUS

## 2016-05-30 MED ORDER — HYDROMORPHONE HCL 1 MG/ML IJ SOLN
1.0000 mg | Freq: Once | INTRAMUSCULAR | Status: AC
  Administered 2016-05-30: 1 mg via INTRAVENOUS
  Filled 2016-05-30: qty 1

## 2016-05-30 MED ORDER — ONDANSETRON HCL 4 MG/2ML IJ SOLN
4.0000 mg | Freq: Four times a day (QID) | INTRAMUSCULAR | Status: DC | PRN
  Administered 2016-05-31 – 2016-06-03 (×10): 4 mg via INTRAVENOUS
  Filled 2016-05-30 (×10): qty 2

## 2016-05-30 MED ORDER — SODIUM CHLORIDE 0.9 % IV BOLUS (SEPSIS)
1000.0000 mL | Freq: Once | INTRAVENOUS | Status: AC
  Administered 2016-05-30: 1000 mL via INTRAVENOUS

## 2016-05-30 MED ORDER — LORAZEPAM 2 MG/ML IJ SOLN
1.0000 mg | Freq: Once | INTRAMUSCULAR | Status: AC
  Administered 2016-05-30: 1 mg via INTRAVENOUS
  Filled 2016-05-30: qty 1

## 2016-05-30 MED ORDER — INSULIN ASPART 100 UNIT/ML ~~LOC~~ SOLN
10.0000 [IU] | Freq: Once | SUBCUTANEOUS | Status: AC
  Administered 2016-05-30: 10 [IU] via SUBCUTANEOUS

## 2016-05-30 MED ORDER — ENOXAPARIN SODIUM 40 MG/0.4ML ~~LOC~~ SOLN
40.0000 mg | SUBCUTANEOUS | Status: DC
  Administered 2016-05-30 – 2016-05-31 (×2): 40 mg via SUBCUTANEOUS
  Filled 2016-05-30 (×4): qty 0.4

## 2016-05-30 MED ORDER — SODIUM CHLORIDE 0.9 % IV SOLN: 1000.0000 mL | Freq: Once | INTRAVENOUS | Status: DC

## 2016-05-30 MED ORDER — GI COCKTAIL ~~LOC~~
30.0000 mL | Freq: Once | ORAL | Status: AC
  Administered 2016-05-30: 30 mL via ORAL
  Filled 2016-05-30: qty 30

## 2016-05-30 MED ORDER — MORPHINE SULFATE (PF) 4 MG/ML IV SOLN
4.0000 mg | Freq: Once | INTRAVENOUS | Status: AC
  Administered 2016-05-30: 4 mg via INTRAVENOUS
  Filled 2016-05-30: qty 1

## 2016-05-30 MED ORDER — HYDROMORPHONE HCL 1 MG/ML IJ SOLN
1.0000 mg | INTRAMUSCULAR | Status: DC | PRN
  Administered 2016-05-30 – 2016-05-31 (×5): 1 mg via INTRAVENOUS
  Filled 2016-05-30 (×6): qty 1

## 2016-05-30 MED ORDER — SODIUM CHLORIDE 0.9 % IV SOLN
INTRAVENOUS | Status: DC
  Administered 2016-05-31: 02:00:00 via INTRAVENOUS

## 2016-05-30 MED ORDER — SODIUM CHLORIDE 0.9 % IV SOLN: 1000.0000 mL | INTRAVENOUS | Status: DC

## 2016-05-30 MED ORDER — DEXTROSE 50 % IV SOLN
1.0000 | Freq: Once | INTRAVENOUS | Status: AC
Start: 2016-05-30 — End: 2016-05-30
  Administered 2016-05-30: 50 mL via INTRAVENOUS
  Filled 2016-05-30: qty 50

## 2016-05-30 MED ORDER — FAMOTIDINE IN NACL 20-0.9 MG/50ML-% IV SOLN
20.0000 mg | Freq: Once | INTRAVENOUS | Status: AC
  Administered 2016-05-30: 20 mg via INTRAVENOUS
  Filled 2016-05-30: qty 50

## 2016-05-30 NOTE — H&P (Addendum)
History and Physical    Diamond [name] [id number] DOB: 1966/06/09 DOA: 05/30/2016  PCP: Irven Shelling, MD  Patient coming from: home  Chief Complaint: Abdominal pain, nausea  HPI: Diamond [name] is a 50 y.o. female with medical history significant of IBS, reflux, hyperlipidemia, hypertension, presents to the emergency room with chief complaint of abdominal pain as well as nausea for the past 1-2 days. She states that her abdominal pain is epigastric, radiating into her back. She complains of nausea however denies any emesis. Her pain was so severe that prompted her to come to the emergency room. She had an similar episode about 1-1/2 weeks ago, however did not seek care and stayed home. That resolved after about 4 days. This episode is much worse. She denies any fever or chills. She denies any chest pain or breathing difficulties. She has no lightheadedness or dizziness.   ED Course: In the ER vital signs are stable, blood work remarkable for hemoglobin of 15.5, mild leukocytosis 12.7, T bili of 1.3 AST of 63, found to have a lipase of 396. Abdominal ultrasound, lipid panel were ordered, and TRH was asked for admission for acute pancreatitis.  Review of Systems: As per HPI otherwise 10 point review of systems negative.   Past Medical History:  Diagnosis Date  . Endometriosis    history of.  Marland Kitchen GERD (gastroesophageal reflux disease)   . Headache    frequent , now less with Prednisone use of 4 weeks.  Marland Kitchen History of colon polyps   . Hyperlipidemia   . Hypertension   . Insomnia   . Restless leg syndrome     Past Surgical History:  Procedure Laterality Date  . ABDOMINAL HYSTERECTOMY     "fibroids, endometriosis"/left oophorectomy  . ARTERY BIOPSY Right 04/07/2016   Procedure: BIOPSY TEMPORAL ARTERY RIGHT;  Surgeon: [name], MD;  Location: [facility];  Service: General;  Laterality: Right;  . COLONOSCOPY WITH PROPOFOL N/A 06/04/2014   Procedure: COLONOSCOPY WITH PROPOFOL;  Surgeon:  Garlan Fair, MD;  Location: [facility];  Service: Endoscopy;  Laterality: N/A;, colon polyps removed.  Marland Kitchen DIAGNOSTIC LAPAROSCOPY     - endometriosis, ovarian cyst, right oophorectomy  . RECTOCELE REPAIR    . TMJ ARTHROPLASTY     retained hardware  . TONSILLECTOMY       reports that she has been smoking Cigarettes.  She has a 22.50 pack-year smoking history. She has never used smokeless tobacco. She reports that she drinks alcohol. She reports that she does not use drugs.  Allergies  Allergen Reactions  . Paroxetine Hcl Other (See Comments)    Suicidal thoughts    Father has diabetes  Prior to Admission medications   Medication Sig Start Date End Date Taking? Authorizing Provider  Biotin 5000 MCG TABS Take 10,000 mcg by mouth daily.   Yes Historical Provider, MD  Cinnamon 500 MG TABS Take 2,000 mg by mouth every morning.    Yes Historical Provider, MD  [facility] Liver Oil CAPS Take 1 capsule by mouth every morning.    Yes Historical Provider, MD  estradiol (ESTRACE) 2 MG tablet Take 2 mg by mouth at bedtime.  08/18/15  Yes Historical Provider, MD  Flaxseed, Linseed, (FLAX SEED OIL) 1000 MG CAPS Take 1,000 mg by mouth daily.   Yes Historical Provider, MD  furosemide (LASIX) 20 MG tablet Take 20 mg by mouth daily as needed for fluid.   Yes Historical Provider, MD  Garlic [id number] MG CAPS Take 1,000 mg  by mouth daily.   Yes Historical Provider, MD  hydrochlorothiazide (HYDRODIURIL) 25 MG tablet Take 25 mg by mouth every morning.    Yes Historical Provider, MD  Multiple Vitamins-Minerals (WOMENS 50+ ADVANCED) CAPS Take 1 tablet by mouth daily.   Yes Historical Provider, MD  omeprazole (PRILOSEC) 40 MG capsule Take 40 mg by mouth every morning.   Yes Historical Provider, MD  potassium chloride SA (K-DUR,KLOR-CON) 20 MEQ tablet Take 20 mEq by mouth daily as needed (when taking furosemide).   Yes Historical Provider, MD  rOPINIRole (REQUIP) 1 MG tablet Take 1 mg by mouth at bedtime.    Yes  Historical Provider, MD  zolpidem (AMBIEN) 10 MG tablet Take 5 mg by mouth at bedtime.    Yes Historical Provider, MD  HYDROcodone-acetaminophen (NORCO/VICODIN) 5-325 MG tablet Take 1-2 tablets by mouth every 6 (six) hours as needed for moderate pain or severe pain. Patient not taking: Reported on 05/30/2016 04/07/16   [name], MD    Physical Exam: Vitals:   05/30/16 1110 05/30/16 1155 05/30/16 1435 05/30/16 1452  BP:  113/89 147/72 137/67  Pulse:  87 99 96  Resp:  18 16 24   Temp:  99.1 F (37.3 C) 98.7 F (37.1 C)   TempSrc:  Oral Oral   SpO2:  100%  95%  Weight: 94.3 kg (208 lb)     Height: 5' 3.5" (1.613 m)       Constitutional: NAD Vitals:   05/30/16 1110 05/30/16 1155 05/30/16 1435 05/30/16 1452  BP:  113/89 147/72 137/67  Pulse:  87 99 96  Resp:  18 16 24   Temp:  99.1 F (37.3 C) 98.7 F (37.1 C)   TempSrc:  Oral Oral   SpO2:  100%  95%  Weight: 94.3 kg (208 lb)     Height: 5' 3.5" (1.613 m)      Eyes: PERRL ENMT: Mucous membranes are dry.  Neck: normal, supple, no masses, no thyromegaly Respiratory: clear to auscultation bilaterally, no wheezing, no crackles. Normal respiratory effort. No accessory muscle use.  Cardiovascular: Regular rate and rhythm, no murmurs / rubs / gallops. No extremity edema. 2+ pedal pulses.  Abdomen: + tenderness epigastricarea, no masses palpated. Bowel sounds positive.  Musculoskeletal: no clubbing / cyanosis. Normal muscle tone.  Skin: no rashes, lesions, ulcers. No induration Neurologic: CN 2-12 grossly intact. Strength 5/5 in all 4.  Psychiatric: Normal judgment and insight. Alert and oriented x 3. Normal mood.   Labs on Admission: I have personally reviewed following labs and imaging studies  CBC:  Recent Labs Lab 05/30/16 1120  WBC 12.7*  HGB 15.5*  HCT 43.7  MCV 87.2  PLT [id number]   Basic Metabolic Panel:  Recent Labs Lab 05/30/16 1120  NA 136  K 4.4  CL 99*  CO2 18*  GLUCOSE 161*  BUN <5*  CREATININE 0.65    CALCIUM 8.9   GFR: Estimated Creatinine Clearance: 90.8 mL/min (by C-G formula based on SCr of 0.8 mg/dL). Liver Function Tests:  Recent Labs Lab 05/30/16 1120  AST 63*  ALT 35  ALKPHOS 45  BILITOT 1.3*  PROT 6.5  ALBUMIN 3.2*    Recent Labs Lab 05/30/16 1120  LIPASE 396*   No results for input(s): AMMONIA in the last 168 hours. Coagulation Profile: No results for input(s): INR, PROTIME in the last 168 hours. Cardiac Enzymes: No results for input(s): CKTOTAL, CKMB, CKMBINDEX, TROPONINI in the last 168 hours. BNP (last 3 results) No results for input(s): PROBNP  in the last 8760 hours. HbA1C: No results for input(s): HGBA1C in the last 72 hours. CBG: No results for input(s): GLUCAP in the last 168 hours. Lipid Profile: No results for input(s): CHOL, HDL, LDLCALC, TRIG, CHOLHDL, LDLDIRECT in the last 72 hours. Thyroid Function Tests: No results for input(s): TSH, T4TOTAL, FREET4, T3FREE, THYROIDAB in the last 72 hours. Anemia Panel: No results for input(s): VITAMINB12, FOLATE, FERRITIN, TIBC, IRON, RETICCTPCT in the last 72 hours. Urine analysis:    Component Value Date/Time   COLORURINE YELLOW 05/30/2016 1200   APPEARANCEUR CLOUDY (A) 05/30/2016 1200   LABSPEC 1.024 05/30/2016 1200   PHURINE 7.0 05/30/2016 1200   GLUCOSEU NEGATIVE 05/30/2016 1200   HGBUR NEGATIVE 05/30/2016 1200   BILIRUBINUR NEGATIVE 05/30/2016 1200   KETONESUR >80 (A) 05/30/2016 1200   PROTEINUR NEGATIVE 05/30/2016 1200   NITRITE NEGATIVE 05/30/2016 1200   LEUKOCYTESUR NEGATIVE 05/30/2016 1200   Sepsis Labs: @LABRCNTIP (procalcitonin:4,lacticidven:4) )No results found for this or any previous visit (from the past 240 hour(s)).   Radiological Exams on Admission: Dg Chest 2 View  Result Date: 05/30/2016 CLINICAL DATA:  Abdominal and chest pain EXAM: CHEST  2 VIEW COMPARISON:  May 09, 2013 FINDINGS: The heart size and mediastinal contours are within normal limits. Both lungs are clear.  The visualized skeletal structures are unremarkable. IMPRESSION: No active cardiopulmonary disease. Electronically Signed   By: Dorise Bullion III M.D   On: 05/30/2016 13:39   Dg Abdomen 1 View  Result Date: 05/30/2016 CLINICAL DATA:  Abdominal pain EXAM: ABDOMEN - 1 VIEW COMPARISON:  None. FINDINGS: The bowel gas pattern is normal. No radio-opaque calculi or other significant radiographic abnormality are seen. IMPRESSION: Negative. Electronically Signed   By: Dorise Bullion III M.D   On: 05/30/2016 13:39    EKG: Independently reviewed. Sinus rhytm  Assessment/Plan Active Problems:   Pancreatitis   Acute pancreatitis   IBS (irritable bowel syndrome)   Acute pancreatitis - Unclear etiology, patient does not drink - Admit patient to MedSurg, nothing by mouth, symptomatic control with antiemetics and pain medications - Obtain right upper quadrant ultrasound to evaluate for gallstones, obtain lipid panel to check triglycerides - She is mildly hemoconcentrated, provide IV fluids 200 mL per hour for 10 hours, then change to 1 25 mL per hour  Hold her other home medications as she is strict nothing by mouth. Assessment diabetes mellitus.  Obesity - She just enrolled into [employer]   Addendum Triglycerides came back elevated ~3000. Give 10 U insulin plus D50, start fenofibrate once able to eat. Repeat triglycerides in am   DVT prophylaxis: lovenox  Code Status: Full  Family Communication: father and stepmother bedside Disposition Plan: admit to medsurg Consults called: none  Admission status: observation    Marzetta Board, MD [facility] Pager [phone]  If 7PM-7AM, please contact night-coverage [email or web address] Password [email or web address]  05/30/2016, 3:23 PM

## 2016-05-30 NOTE — ED Triage Notes (Signed)
Patient c/o mid abd pain that started yesterday. Patient has little nausea, but denies v/d or constipation. Patient states that she has been trying to make her self "pass gas". Patient states that she can't lay down and hasnt been able to eat this morning.

## 2016-05-30 NOTE — ED Provider Notes (Signed)
[facility] DEPT Provider Note   CSN: [id number] Arrival date & time: 05/30/16  1057     History   Chief Complaint Chief Complaint  Patient presents with  . Abdominal Pain    HPI Diamond [name] is a 50 y.o. female.  HPI   Diamond [name] is a 50 y.o. female with PMH significant for GERD, hyperlipidemia, and hypertension, who presents with gradual onset, constant, worsening epigastric abdominal pain that'll occasionally radiate to her mid abdomen and back. She describes it as sharp. She states the pain started yesterday in between breakfast and lunch. It's worse lying down. She has not been able to tolerate by mouth intake. Associated symptoms include nausea, chills, chest pain, shortness of breath, and increased belching. No vomiting, diaphoresis, diarrhea, constipation, bloody stools, or urinary symptoms. Last BM was this morning and normal. Took hydrocodone this morning without relief. This did happen last week for about 4 days but resolved. She takes Nexium daily. She smokes less than half a pack a day. Denies alcohol use. Abdominal surgeries include hysterectomy. No family history of sudden cardiac death.  Gastroenterologist: Dr. Wynetta Emery.  Last colonoscopy 2015.  Has gastroparesis.    Past Medical History:  Diagnosis Date  . Endometriosis    history of.  Marland Kitchen GERD (gastroesophageal reflux disease)   . Headache    frequent , now less with Prednisone use of 4 weeks.  Marland Kitchen History of colon polyps   . Hyperlipidemia   . Hypertension   . Insomnia   . Restless leg syndrome     Patient Active Problem List   Diagnosis Date Noted  . Pancreatitis 05/30/2016    Past Surgical History:  Procedure Laterality Date  . ABDOMINAL HYSTERECTOMY     "fibroids, endometriosis"/left oophorectomy  . ARTERY BIOPSY Right 04/07/2016   Procedure: BIOPSY TEMPORAL ARTERY RIGHT;  Surgeon: [name], MD;  Location: [facility]S;  Service: General;  Laterality: Right;  . COLONOSCOPY WITH PROPOFOL N/A  06/04/2014   Procedure: COLONOSCOPY WITH PROPOFOL;  Surgeon: Garlan Fair, MD;  Location: [facility];  Service: Endoscopy;  Laterality: N/A;, colon polyps removed.  Marland Kitchen DIAGNOSTIC LAPAROSCOPY     - endometriosis, ovarian cyst, right oophorectomy  . RECTOCELE REPAIR    . TMJ ARTHROPLASTY     retained hardware  . TONSILLECTOMY      OB History    No data available       Home Medications    Prior to Admission medications   Medication Sig Start Date End Date Taking? Authorizing Provider  Biotin 5000 MCG TABS Take 10,000 mcg by mouth daily.   Yes Historical Provider, MD  Cinnamon 500 MG TABS Take 2,000 mg by mouth every morning.    Yes Historical Provider, MD  [facility] Liver Oil CAPS Take 1 capsule by mouth every morning.    Yes Historical Provider, MD  estradiol (ESTRACE) 2 MG tablet Take 2 mg by mouth at bedtime.  08/18/15  Yes Historical Provider, MD  Flaxseed, Linseed, (FLAX SEED OIL) 1000 MG CAPS Take 1,000 mg by mouth daily.   Yes Historical Provider, MD  furosemide (LASIX) 20 MG tablet Take 20 mg by mouth daily as needed for fluid.   Yes Historical Provider, MD  Garlic [id number] MG CAPS Take 1,000 mg by mouth daily.   Yes Historical Provider, MD  hydrochlorothiazide (HYDRODIURIL) 25 MG tablet Take 25 mg by mouth every morning.    Yes Historical Provider, MD  Multiple Vitamins-Minerals (WOMENS 50+ ADVANCED) CAPS Take 1 tablet  by mouth daily.   Yes Historical Provider, MD  omeprazole (PRILOSEC) 40 MG capsule Take 40 mg by mouth every morning.   Yes Historical Provider, MD  potassium chloride SA (K-DUR,KLOR-CON) 20 MEQ tablet Take 20 mEq by mouth daily as needed (when taking furosemide).   Yes Historical Provider, MD  rOPINIRole (REQUIP) 1 MG tablet Take 1 mg by mouth at bedtime.    Yes Historical Provider, MD  zolpidem (AMBIEN) 10 MG tablet Take 5 mg by mouth at bedtime.    Yes Historical Provider, MD  HYDROcodone-acetaminophen (NORCO/VICODIN) 5-325 MG tablet Take 1-2 tablets by mouth every  6 (six) hours as needed for moderate pain or severe pain. Patient not taking: Reported on 05/30/2016 04/07/16   [name], MD    Family History No family history on file.  Social History Social History  Substance Use Topics  . Smoking status: Light Tobacco Smoker    Packs/day: 0.75    Years: 30.00    Types: Cigarettes    Last attempt to quit: 03/23/2013  . Smokeless tobacco: Never Used     Comment: Quit  7'14, 04-06-16" occ. has a rare ciagarette with stress situations"  . Alcohol use Yes     Comment: occ. social     Allergies   Paroxetine hcl   Review of Systems Review of Systems All other systems negative unless otherwise stated in HPI   Physical Exam Updated Vital Signs BP 137/67   Pulse 96   Temp 98.7 F (37.1 C) (Oral)   Resp 24   Ht 5' 3.5" (1.613 m)   Wt 94.3 kg   SpO2 95%   BMI 36.27 kg/m   Physical Exam  Constitutional: She is oriented to person, place, and time. She appears well-developed and well-nourished.  Non-toxic appearance. She does not have a sickly appearance. She does not appear ill.  Appears slightly uncomfortable.   HENT:  Head: Normocephalic and atraumatic.  Mouth/Throat: Oropharynx is clear and moist.  Eyes: Conjunctivae are normal. Pupils are equal, round, and reactive to light.  Neck: Normal range of motion. Neck supple.  Cardiovascular: Normal rate and regular rhythm.   Pulmonary/Chest: Effort normal and breath sounds normal. No accessory muscle usage or stridor. No respiratory distress. She has no wheezes. She has no rhonchi. She has no rales.  Abdominal: Soft. Bowel sounds are normal. She exhibits distension (mild). There is tenderness in the right upper quadrant, epigastric area and left upper quadrant. There is no rebound and no guarding.  Musculoskeletal: Normal range of motion.  Lymphadenopathy:    She has no cervical adenopathy.  Neurological: She is alert and oriented to person, place, and time.  Speech clear without  dysarthria.  Skin: Skin is warm and dry.  Psychiatric: She has a normal mood and affect. Her behavior is normal.     ED Treatments / Results  Labs (all labs ordered are listed, but only abnormal results are displayed) Labs Reviewed  LIPASE, BLOOD - Abnormal; Notable for the following:       Result Value   Lipase 396 (*)    All other components within normal limits  COMPREHENSIVE METABOLIC PANEL - Abnormal; Notable for the following:    Chloride 99 (*)    CO2 18 (*)    Glucose, Bld 161 (*)    BUN <5 (*)    Albumin 3.2 (*)    AST 63 (*)    Total Bilirubin 1.3 (*)    Anion gap 19 (*)  All other components within normal limits  CBC - Abnormal; Notable for the following:    WBC 12.7 (*)    Hemoglobin 15.5 (*)    All other components within normal limits  URINALYSIS, ROUTINE W REFLEX MICROSCOPIC (NOT AT [facility]) - Abnormal; Notable for the following:    APPearance CLOUDY (*)    Ketones, ur >80 (*)    All other components within normal limits  TRIGLYCERIDES  I-STAT TROPOININ, ED    EKG  EKG Interpretation  Date/Time:  Sunday May 30 2016 12:36:35 EDT Ventricular Rate:  84 PR Interval:    QRS Duration: 96 QT Interval:  384 QTC Calculation: 454 R Axis:   11 Text Interpretation:  Sinus rhythm Low voltage, precordial leads No significant change since last tracing Confirmed by [facility] MD, [place] ([id number]) on 05/30/2016 12:49:30 PM       Radiology Dg Chest 2 View  Result Date: 05/30/2016 CLINICAL DATA:  Abdominal and chest pain EXAM: CHEST  2 VIEW COMPARISON:  May 09, 2013 FINDINGS: The heart size and mediastinal contours are within normal limits. Both lungs are clear. The visualized skeletal structures are unremarkable. IMPRESSION: No active cardiopulmonary disease. Electronically Signed   By: Dorise Bullion III M.D   On: 05/30/2016 13:39   Dg Abdomen 1 View  Result Date: 05/30/2016 CLINICAL DATA:  Abdominal pain EXAM: ABDOMEN - 1 VIEW COMPARISON:  None. FINDINGS:  The bowel gas pattern is normal. No radio-opaque calculi or other significant radiographic abnormality are seen. IMPRESSION: Negative. Electronically Signed   By: Dorise Bullion III M.D   On: 05/30/2016 13:39    Procedures Procedures (including critical care time)  Medications Ordered in ED Medications  0.9 %  sodium chloride infusion (not administered)    Followed by  0.9 %  sodium chloride infusion (not administered)  gi cocktail (Maalox,Lidocaine,Donnatal) (30 mLs Oral Given 05/30/16 1222)  famotidine (PEPCID) IVPB 20 mg premix (0 mg Intravenous Stopped 05/30/16 1329)  sodium chloride 0.9 % bolus 1,000 mL (0 mLs Intravenous Stopped 05/30/16 1434)  morphine 4 MG/ML injection 4 mg (4 mg Intravenous Given 05/30/16 1241)  LORazepam (ATIVAN) injection 1 mg (1 mg Intravenous Given 05/30/16 1327)  HYDROmorphone (DILAUDID) injection 1 mg (1 mg Intravenous Given 05/30/16 1441)     Initial Impression / Assessment and Plan / ED Course  I have reviewed the triage vital signs and the nursing notes.  Pertinent labs & imaging results that were available during my care of the patient were reviewed by me and considered in my medical decision making (see chart for details).  Clinical Course   Patient presents with gradual onset of epigastric abdominal pain with nausea. History of acid reflux. VSS, NAD, although patient appears uncomfortable.  Abdomen mildly distended with tenderness in the epigastric, left upper quadrant, and right upper quadrant without rebound, guarding, or rigidity.  DDx includes gall bladder disease, pancreatitis, gastritis, perforated viscous, SBO, MI, AAA.  Will give fluids, gi cocktail, and pepcid.  Will obtain EKG, CXR, AXR, and labs. Workup indicates pancreatitis, likely 2/2 to elevated triglycerides.  Mild elevation of AST; otherwise, normal LFTs, ultrasound pending to evaluate for stone.  Pain controlled now with Dilaudid.  Will admit to medicine for hydration, pain control, and  pancreas rest. Appreciate Dr. Cruzita Lederer.  Case has been discussed with Dr. Billy Fischer who agrees with the above plan for admission.    Final Clinical Impressions(s) / ED Diagnoses   Final diagnoses:  Acute pancreatitis, unspecified pancreatitis type    New  Prescriptions New Prescriptions   No medications on file     Gloriann Loan, PA-C 05/30/16 [place], MD 06/01/16 2154

## 2016-05-31 DIAGNOSIS — Z79899 Other long term (current) drug therapy: Secondary | ICD-10-CM | POA: Diagnosis not present

## 2016-05-31 DIAGNOSIS — Z86010 Personal history of colonic polyps: Secondary | ICD-10-CM | POA: Diagnosis not present

## 2016-05-31 DIAGNOSIS — E781 Pure hyperglyceridemia: Secondary | ICD-10-CM | POA: Diagnosis present

## 2016-05-31 DIAGNOSIS — R109 Unspecified abdominal pain: Secondary | ICD-10-CM | POA: Diagnosis present

## 2016-05-31 DIAGNOSIS — K589 Irritable bowel syndrome without diarrhea: Secondary | ICD-10-CM | POA: Diagnosis not present

## 2016-05-31 DIAGNOSIS — K219 Gastro-esophageal reflux disease without esophagitis: Secondary | ICD-10-CM | POA: Diagnosis present

## 2016-05-31 DIAGNOSIS — K858 Other acute pancreatitis without necrosis or infection: Secondary | ICD-10-CM | POA: Diagnosis not present

## 2016-05-31 DIAGNOSIS — E785 Hyperlipidemia, unspecified: Secondary | ICD-10-CM | POA: Diagnosis present

## 2016-05-31 DIAGNOSIS — Z6836 Body mass index (BMI) 36.0-36.9, adult: Secondary | ICD-10-CM | POA: Diagnosis not present

## 2016-05-31 DIAGNOSIS — I10 Essential (primary) hypertension: Secondary | ICD-10-CM | POA: Diagnosis present

## 2016-05-31 DIAGNOSIS — E876 Hypokalemia: Secondary | ICD-10-CM | POA: Diagnosis present

## 2016-05-31 DIAGNOSIS — F17210 Nicotine dependence, cigarettes, uncomplicated: Secondary | ICD-10-CM | POA: Diagnosis present

## 2016-05-31 DIAGNOSIS — Z833 Family history of diabetes mellitus: Secondary | ICD-10-CM | POA: Diagnosis not present

## 2016-05-31 DIAGNOSIS — K859 Acute pancreatitis without necrosis or infection, unspecified: Secondary | ICD-10-CM | POA: Diagnosis present

## 2016-05-31 DIAGNOSIS — E669 Obesity, unspecified: Secondary | ICD-10-CM | POA: Diagnosis present

## 2016-05-31 DIAGNOSIS — G2581 Restless legs syndrome: Secondary | ICD-10-CM | POA: Diagnosis present

## 2016-05-31 LAB — HEMOGLOBIN A1C
HEMOGLOBIN A1C: 7.3 % — AB (ref 4.8–5.6)
Mean Plasma Glucose: 163 mg/dL

## 2016-05-31 LAB — TRIGLYCERIDES: TRIGLYCERIDES: 1781 mg/dL — AB (ref <150)

## 2016-05-31 LAB — CBC
HCT: 39.6 % (ref 36.0–46.0)
HEMOGLOBIN: 13.6 g/dL (ref 12.0–15.0)
MCH: 29.8 pg (ref 26.0–34.0)
MCHC: 34.3 g/dL (ref 30.0–36.0)
MCV: 86.7 fL (ref 78.0–100.0)
Platelets: 206 10*3/uL (ref 150–400)
RBC: 4.57 MIL/uL (ref 3.87–5.11)
RDW: 14.7 % (ref 11.5–15.5)
WBC: 12.3 10*3/uL — ABNORMAL HIGH (ref 4.0–10.5)

## 2016-05-31 LAB — COMPREHENSIVE METABOLIC PANEL
ALBUMIN: 3.2 g/dL — AB (ref 3.5–5.0)
ALK PHOS: 38 U/L (ref 38–126)
ALT: 38 U/L (ref 14–54)
ANION GAP: 11 (ref 5–15)
AST: 31 U/L (ref 15–41)
BILIRUBIN TOTAL: 0.8 mg/dL (ref 0.3–1.2)
CALCIUM: 7.8 mg/dL — AB (ref 8.9–10.3)
CO2: 27 mmol/L (ref 22–32)
Chloride: 97 mmol/L — ABNORMAL LOW (ref 101–111)
Creatinine, Ser: 0.53 mg/dL (ref 0.44–1.00)
GFR calc Af Amer: >60 mL/min (ref >60)
GLUCOSE: 165 mg/dL — AB (ref 65–99)
Potassium: 3 mmol/L — ABNORMAL LOW (ref 3.5–5.1)
Sodium: 135 mmol/L (ref 135–145)
TOTAL PROTEIN: 6.3 g/dL — AB (ref 6.5–8.1)

## 2016-05-31 LAB — LIPASE, BLOOD: LIPASE: 104 U/L — AB (ref 11–51)

## 2016-05-31 MED ORDER — HYDROMORPHONE HCL 1 MG/ML IJ SOLN
1.0000 mg | INTRAMUSCULAR | Status: DC | PRN
  Administered 2016-05-31 – 2016-06-02 (×7): 1 mg via INTRAVENOUS
  Filled 2016-05-31 (×8): qty 1

## 2016-05-31 MED ORDER — HYDROMORPHONE HCL 1 MG/ML IJ SOLN
1.0000 mg | Freq: Once | INTRAMUSCULAR | Status: AC
  Administered 2016-05-31: 1 mg via INTRAVENOUS
  Filled 2016-05-31: qty 1

## 2016-05-31 MED ORDER — INSULIN ASPART 100 UNIT/ML ~~LOC~~ SOLN
10.0000 [IU] | Freq: Once | SUBCUTANEOUS | Status: AC
  Administered 2016-05-31: 10 [IU] via SUBCUTANEOUS

## 2016-05-31 MED ORDER — ONDANSETRON HCL 4 MG/2ML IJ SOLN: 4.0000 mg | Freq: Once | INTRAMUSCULAR | Status: DC

## 2016-05-31 MED ORDER — ZOLPIDEM TARTRATE 5 MG PO TABS
5.0000 mg | ORAL_TABLET | Freq: Every evening | ORAL | Status: DC | PRN
  Administered 2016-06-02: 5 mg via ORAL
  Filled 2016-05-31: qty 1

## 2016-05-31 MED ORDER — FAMOTIDINE IN NACL 20-0.9 MG/50ML-% IV SOLN
20.0000 mg | Freq: Two times a day (BID) | INTRAVENOUS | Status: DC
  Administered 2016-05-31 – 2016-06-03 (×7): 20 mg via INTRAVENOUS
  Filled 2016-05-31 (×7): qty 50

## 2016-05-31 MED ORDER — METOCLOPRAMIDE HCL 5 MG/5ML PO SOLN
10.0000 mg | Freq: Once | ORAL | Status: DC
  Filled 2016-05-31: qty 10

## 2016-05-31 MED ORDER — DEXTROSE 50 % IV SOLN
1.0000 | Freq: Once | INTRAVENOUS | Status: AC
  Administered 2016-05-31: 50 mL via INTRAVENOUS
  Filled 2016-05-31: qty 50

## 2016-05-31 MED ORDER — DIPHENHYDRAMINE HCL 50 MG/ML IJ SOLN
25.0000 mg | Freq: Once | INTRAMUSCULAR | Status: AC
  Administered 2016-05-31: 25 mg via INTRAVENOUS
  Filled 2016-05-31: qty 1

## 2016-05-31 MED ORDER — ROPINIROLE HCL 1 MG PO TABS
1.0000 mg | ORAL_TABLET | Freq: Every day | ORAL | Status: DC
  Administered 2016-05-31 – 2016-06-02 (×3): 1 mg via ORAL
  Filled 2016-05-31 (×3): qty 1

## 2016-05-31 MED ORDER — KETOROLAC TROMETHAMINE 30 MG/ML IJ SOLN
30.0000 mg | Freq: Once | INTRAMUSCULAR | Status: AC
  Administered 2016-05-31: 30 mg via INTRAVENOUS
  Filled 2016-05-31: qty 1

## 2016-05-31 MED ORDER — ONDANSETRON HCL 4 MG/2ML IJ SOLN
4.0000 mg | Freq: Once | INTRAMUSCULAR | Status: AC
  Administered 2016-05-31: 4 mg via INTRAVENOUS
  Filled 2016-05-31: qty 2

## 2016-05-31 MED ORDER — PANTOPRAZOLE SODIUM 40 MG PO TBEC
40.0000 mg | DELAYED_RELEASE_TABLET | Freq: Every day | ORAL | Status: DC
  Administered 2016-05-31 – 2016-06-03 (×4): 40 mg via ORAL
  Filled 2016-05-31 (×4): qty 1

## 2016-05-31 MED ORDER — SODIUM CHLORIDE 0.9 % IV SOLN
INTRAVENOUS | Status: AC
  Administered 2016-05-31: 21:00:00 via INTRAVENOUS
  Administered 2016-05-31: 125 mL/h via INTRAVENOUS
  Administered 2016-06-01: 05:00:00 via INTRAVENOUS

## 2016-05-31 MED ORDER — FENOFIBRATE 160 MG PO TABS
160.0000 mg | ORAL_TABLET | Freq: Every day | ORAL | Status: DC
  Administered 2016-05-31 – 2016-06-03 (×4): 160 mg via ORAL
  Filled 2016-05-31 (×4): qty 1

## 2016-05-31 MED ORDER — POTASSIUM CHLORIDE CRYS ER 20 MEQ PO TBCR
40.0000 meq | EXTENDED_RELEASE_TABLET | ORAL | Status: AC
  Administered 2016-05-31 (×2): 40 meq via ORAL
  Filled 2016-05-31 (×2): qty 2

## 2016-05-31 NOTE — Progress Notes (Signed)
PROGRESS NOTE  Diamond [name] [id number] DOB: 02/23/1966 DOA: 05/30/2016 PCP: Irven Shelling, MD   LOS: 0 days   Brief Narrative: Diamond [name] is a 50 y.o. female with medical history significant of IBS, reflux, hyperlipidemia, hypertension, presents to the emergency room with chief complaint of abdominal pain as well as nausea for the past 1-2 days, found to have acute pancreatitis due to hypertriglyceridemia   Assessment & Plan: Active Problems:   Pancreatitis   Acute pancreatitis   IBS (irritable bowel syndrome)   Hypertriglyceridemia   Acute pancreatitis - due to elevated TG - ongoing abdominal pain overnight, keep NPO. Start home meds. Clears later today if she is better  Hypertriglyceridemia   - insulin/d50, fluids - start fenofibrate today  GERD - resume PPI  HTN - hold HCTZ for now  Hypokalemia - replete  DVT prophylaxis: Lovenox Code Status: Full Family Communication: no family bedside Disposition Plan: home when ready  Consultants:   None   Procedures:   None   Antimicrobials:  None    Subjective: - ongoing abdominal pain, relieved by pain medications however back when meds wear off  Objective: Vitals:   05/30/16 1452 05/30/16 1631 05/30/16 2115 05/31/16 0520  BP: 137/67 129/69 (!) 124/54 124/61  Pulse: 96 98 93 88  Resp: 24 18 16 18   Temp:  98.3 F (36.8 C) 98.4 F (36.9 C) 99.2 F (37.3 C)  TempSrc:  Oral Oral Oral  SpO2: 95% 96% 95% 93%  Weight:      Height:        Intake/Output Summary (Last 24 hours) at 05/31/16 0744 Last data filed at 05/31/16 0600  Gross per 24 hour  Intake            607.5 ml  Output                0 ml  Net            607.5 ml   Filed Weights   05/30/16 1110  Weight: 94.3 kg (208 lb)    Examination: Constitutional: NAD Vitals:   05/30/16 1452 05/30/16 1631 05/30/16 2115 05/31/16 0520  BP: 137/67 129/69 (!) 124/54 124/61  Pulse: 96 98 93 88  Resp: 24 18 16 18   Temp:  98.3 F (36.8 C)  98.4 F (36.9 C) 99.2 F (37.3 C)  TempSrc:  Oral Oral Oral  SpO2: 95% 96% 95% 93%  Weight:      Height:       ENMT: Mucous membranes are moist. Neck: normal, supple Respiratory: clear to auscultation bilaterally, no wheezing, no crackles. Normal respiratory effort. No accessory muscle use.  Cardiovascular: Regular rate and rhythm, no murmurs / rubs / gallops. No LE edema. 2+ pedal pulses. No carotid bruits.  Abdomen: + epigastric tenderness. Bowel sounds positive.  Musculoskeletal: no clubbing / cyanosis.  Neuro: non focao    Data Reviewed: I have personally reviewed following labs and imaging studies  CBC:  Recent Labs Lab 05/30/16 1120 05/31/16 0420  WBC 12.7* 12.3*  HGB 15.5* 13.6  HCT 43.7 39.6  MCV 87.2 86.7  PLT 231 [id number]   Basic Metabolic Panel:  Recent Labs Lab 05/30/16 1120 05/31/16 0420  NA 136 135  K 4.4 3.0*  CL 99* 97*  CO2 18* 27  GLUCOSE 161* 165*  BUN <5* <5*  CREATININE 0.65 0.53  CALCIUM 8.9 7.8*   GFR: Estimated Creatinine Clearance: 90.8 mL/min (by C-G formula based on SCr of 0.8 mg/dL). Liver  Function Tests:  Recent Labs Lab 05/30/16 1120 05/31/16 0420  AST 63* 31  ALT 35 38  ALKPHOS 45 38  BILITOT 1.3* 0.8  PROT 6.5 6.3*  ALBUMIN 3.2* 3.2*    Recent Labs Lab 05/30/16 1120  LIPASE 396*   No results for input(s): AMMONIA in the last 168 hours. Coagulation Profile: No results for input(s): INR, PROTIME in the last 168 hours. Cardiac Enzymes: No results for input(s): CKTOTAL, CKMB, CKMBINDEX, TROPONINI in the last 168 hours. BNP (last 3 results) No results for input(s): PROBNP in the last 8760 hours. HbA1C: No results for input(s): HGBA1C in the last 72 hours. CBG:  Recent Labs Lab 05/30/16 2120  GLUCAP 184*   Lipid Profile:  Recent Labs  05/30/16 1324  TRIG 3,153*   Thyroid Function Tests: No results for input(s): TSH, T4TOTAL, FREET4, T3FREE, THYROIDAB in the last 72 hours. Anemia Panel: No results for  input(s): VITAMINB12, FOLATE, FERRITIN, TIBC, IRON, RETICCTPCT in the last 72 hours. Urine analysis:    Component Value Date/Time   COLORURINE YELLOW 05/30/2016 1200   APPEARANCEUR CLOUDY (A) 05/30/2016 1200   LABSPEC 1.024 05/30/2016 1200   PHURINE 7.0 05/30/2016 1200   GLUCOSEU NEGATIVE 05/30/2016 1200   HGBUR NEGATIVE 05/30/2016 1200   BILIRUBINUR NEGATIVE 05/30/2016 1200   KETONESUR >80 (A) 05/30/2016 1200   PROTEINUR NEGATIVE 05/30/2016 1200   NITRITE NEGATIVE 05/30/2016 1200   LEUKOCYTESUR NEGATIVE 05/30/2016 1200   Sepsis Labs: Invalid input(s): PROCALCITONIN, LACTICIDVEN  No results found for this or any previous visit (from the past 240 hour(s)).    Radiology Studies: Dg Chest 2 View  Result Date: 05/30/2016 CLINICAL DATA:  Abdominal and chest pain EXAM: CHEST  2 VIEW COMPARISON:  May 09, 2013 FINDINGS: The heart size and mediastinal contours are within normal limits. Both lungs are clear. The visualized skeletal structures are unremarkable. IMPRESSION: No active cardiopulmonary disease. Electronically Signed   By: Dorise Bullion III M.D   On: 05/30/2016 13:39   Dg Abdomen 1 View  Result Date: 05/30/2016 CLINICAL DATA:  Abdominal pain EXAM: ABDOMEN - 1 VIEW COMPARISON:  None. FINDINGS: The bowel gas pattern is normal. No radio-opaque calculi or other significant radiographic abnormality are seen. IMPRESSION: Negative. Electronically Signed   By: Dorise Bullion III M.D   On: 05/30/2016 13:39   US Abdomen Complete  Result Date: 05/30/2016 CLINICAL DATA:  Epigastric abdominal pain with elevated lipase. EXAM: ABDOMEN ULTRASOUND COMPLETE COMPARISON:  None. FINDINGS: Gallbladder: No gallstones or gallbladder wall thickening. No pericholecystic fluid. The sonographer reports no sonographic Murphy's sign. Common bile duct: Diameter: 5 mm Liver: Echogenic parenchyma with poor acoustic through transmission. No focal abnormality evident. IVC: Obscured by overlying bowel gas.  Pancreas: Visualized portion unremarkable. Spleen: Size and appearance within normal limits. Right Kidney: Length: 13.9 cm. Echogenicity within normal limits. No mass or hydronephrosis visualized. Left Kidney: Length: 13.0 cm. Echogenicity within normal limits. No mass or hydronephrosis visualized. Abdominal aorta: Obscured by overlying bowel gas. Other findings: None. IMPRESSION: Unremarkable abdominal ultrasound. If there is clinical concern for pancreatitis, CT imaging may prove helpful to further evaluate. Electronically Signed   By: Misty Stanley M.D.   On: 05/30/2016 15:59     Scheduled Meds: . sodium chloride  1,000 mL Intravenous Once  . enoxaparin (LOVENOX) injection  40 mg Subcutaneous Q24H  . famotidine (PEPCID) IV  20 mg Intravenous Q12H  . fenofibrate  160 mg Oral Daily  . metoCLOPramide  10 mg Oral Once  . pantoprazole  40 mg Oral Daily  . potassium chloride  40 mEq Oral Q3H  . rOPINIRole  1 mg Oral QHS   Continuous Infusions: . sodium chloride       Marzetta Board, MD, PhD [facility] Pager [phone]  If 7PM-7AM, please contact night-coverage [email or web address] Password [facility] 05/31/2016, 7:44 AM

## 2016-06-01 ENCOUNTER — Inpatient Hospital Stay (HOSPITAL_COMMUNITY): Payer: 59

## 2016-06-01 LAB — COMPREHENSIVE METABOLIC PANEL
ALBUMIN: 2.9 g/dL — AB (ref 3.5–5.0)
ALT: 18 U/L (ref 14–54)
ANION GAP: 10 (ref 5–15)
AST: 15 U/L (ref 15–41)
Alkaline Phosphatase: 36 U/L — ABNORMAL LOW (ref 38–126)
BILIRUBIN TOTAL: 0.6 mg/dL (ref 0.3–1.2)
BUN: <5 mg/dL — ABNORMAL LOW (ref 6–20)
CO2: 22 mmol/L (ref 22–32)
Calcium: 7.5 mg/dL — ABNORMAL LOW (ref 8.9–10.3)
Chloride: 105 mmol/L (ref 101–111)
Creatinine, Ser: 0.66 mg/dL (ref 0.44–1.00)
GFR calc non Af Amer: >60 mL/min (ref >60)
GLUCOSE: 160 mg/dL — AB (ref 65–99)
POTASSIUM: 2.9 mmol/L — AB (ref 3.5–5.1)
Sodium: 137 mmol/L (ref 135–145)
TOTAL PROTEIN: 6 g/dL — AB (ref 6.5–8.1)

## 2016-06-01 LAB — TRIGLYCERIDES: TRIGLYCERIDES: 938 mg/dL — AB (ref <150)

## 2016-06-01 MED ORDER — IOPAMIDOL (ISOVUE-300) INJECTION 61%
15.0000 mL | Freq: Once | INTRAVENOUS | Status: AC | PRN
  Administered 2016-06-01 (×2): 15 mL via ORAL
  Filled 2016-06-01 (×2): qty 30

## 2016-06-01 MED ORDER — POTASSIUM CHLORIDE 10 MEQ/100ML IV SOLN
10.0000 meq | INTRAVENOUS | Status: AC
  Administered 2016-06-01 (×2): 10 meq via INTRAVENOUS
  Filled 2016-06-01 (×2): qty 100

## 2016-06-01 MED ORDER — ACETAMINOPHEN 325 MG PO TABS
650.0000 mg | ORAL_TABLET | Freq: Four times a day (QID) | ORAL | Status: DC | PRN
  Administered 2016-06-01: 650 mg via ORAL
  Filled 2016-06-01: qty 2

## 2016-06-01 MED ORDER — POTASSIUM CHLORIDE CRYS ER 20 MEQ PO TBCR
40.0000 meq | EXTENDED_RELEASE_TABLET | ORAL | Status: AC
  Administered 2016-06-01 (×2): 40 meq via ORAL
  Filled 2016-06-01 (×2): qty 2

## 2016-06-01 MED ORDER — IOPAMIDOL (ISOVUE-300) INJECTION 61%
100.0000 mL | Freq: Once | INTRAVENOUS | Status: AC | PRN
  Administered 2016-06-01: 100 mL via INTRAVENOUS

## 2016-06-01 NOTE — Progress Notes (Signed)
PROGRESS NOTE  Diamond [name] [id number] DOB: 24-Jan-1966 DOA: 05/30/2016 PCP: Irven Shelling, MD   LOS: 1 day   Brief Narrative: Diamond [name] is a 50 y.o. female with medical history significant of IBS, reflux, hyperlipidemia, hypertension, presents to the emergency room with chief complaint of abdominal pain as well as nausea for the past 1-2 days, found to have acute pancreatitis due to hypertriglyceridemia   Assessment & Plan: Active Problems:   Pancreatitis   Acute pancreatitis   IBS (irritable bowel syndrome)   Hypertriglyceridemia   Acute pancreatitis - due to elevated TG - ongoing abdominal pain overnight, keep NPO - Given persistent symptoms will obtain CT scan of the abdomen and pelvis with contrast  Hypertriglyceridemia   - insulin/d50, fluids x 2  - Continue fenofibrate, started on 9/11 - Triglycerides improving, 938 this morning from 3000 on admission  GERD - resume PPI  HTN - hold HCTZ for now  Hypokalemia - replete again today IV and PO   DVT prophylaxis: Lovenox Code Status: Full Family Communication: no family bedside Disposition Plan: home when ready  Consultants:   None   Procedures:   None   Antimicrobials:  None    Subjective: - ongoing abdominal pain, relieved by pain medications however she's hurting when meds wear off - Overall she feels somewhat improved today, and had a better night  Objective: Vitals:   05/30/16 1631 05/30/16 2115 05/31/16 0520 05/31/16 2055  BP: 129/69 (!) 124/54 124/61 118/60  Pulse: 98 93 88 92  Resp: 18 16 18 18   Temp: 98.3 F (36.8 C) 98.4 F (36.9 C) 99.2 F (37.3 C) 98.4 F (36.9 C)  TempSrc: Oral Oral Oral Oral  SpO2: 96% 95% 93% 94%  Weight:      Height:        Intake/Output Summary (Last 24 hours) at 06/01/16 1035 Last data filed at 06/01/16 1022  Gross per 24 hour  Intake          2260.42 ml  Output             2125 ml  Net           135.42 ml   Filed Weights   05/30/16 1110   Weight: 94.3 kg (208 lb)    Examination: Constitutional: NAD Vitals:   05/30/16 1631 05/30/16 2115 05/31/16 0520 05/31/16 2055  BP: 129/69 (!) 124/54 124/61 118/60  Pulse: 98 93 88 92  Resp: 18 16 18 18   Temp: 98.3 F (36.8 C) 98.4 F (36.9 C) 99.2 F (37.3 C) 98.4 F (36.9 C)  TempSrc: Oral Oral Oral Oral  SpO2: 96% 95% 93% 94%  Weight:      Height:       ENMT: Mucous membranes are moist. Neck: normal, supple Respiratory: clear to auscultation bilaterally, no wheezing, no crackles. Normal respiratory effort. No accessory muscle use.  Cardiovascular: Regular rate and rhythm, no murmurs / rubs / gallops. No LE edema. 2+ pedal pulses. No carotid bruits.  Abdomen: + epigastric tenderness. Bowel sounds positive.  Musculoskeletal: no clubbing / cyanosis.  Neuro: non focao    Data Reviewed: I have personally reviewed following labs and imaging studies  CBC:  Recent Labs Lab 05/30/16 1120 05/31/16 0420  WBC 12.7* 12.3*  HGB 15.5* 13.6  HCT 43.7 39.6  MCV 87.2 86.7  PLT 231 [id number]   Basic Metabolic Panel:  Recent Labs Lab 05/30/16 1120 05/31/16 0420 06/01/16 0510  NA 136 135 137  K 4.4 3.0*  2.9*  CL 99* 97* 105  CO2 18* 27 22  GLUCOSE 161* 165* 160*  BUN <5* <5* <5*  CREATININE 0.65 0.53 0.66  CALCIUM 8.9 7.8* 7.5*   GFR: Estimated Creatinine Clearance: 90.8 mL/min (by C-G formula based on SCr of 0.8 mg/dL). Liver Function Tests:  Recent Labs Lab 05/30/16 1120 05/31/16 0420 06/01/16 0510  AST 63* 31 15  ALT 35 38 18  ALKPHOS 45 38 36*  BILITOT 1.3* 0.8 0.6  PROT 6.5 6.3* 6.0*  ALBUMIN 3.2* 3.2* 2.9*    Recent Labs Lab 05/30/16 1120 05/31/16 0846  LIPASE 396* 104*   No results for input(s): AMMONIA in the last 168 hours. Coagulation Profile: No results for input(s): INR, PROTIME in the last 168 hours. Cardiac Enzymes: No results for input(s): CKTOTAL, CKMB, CKMBINDEX, TROPONINI in the last 168 hours. BNP (last 3 results) No results for  input(s): PROBNP in the last 8760 hours. HbA1C:  Recent Labs  05/30/16 1120  HGBA1C 7.3*   CBG:  Recent Labs Lab 05/30/16 2120  GLUCAP 184*   Lipid Profile:  Recent Labs  05/31/16 0420 06/01/16 0510  TRIG 1,781* 938*   Thyroid Function Tests: No results for input(s): TSH, T4TOTAL, FREET4, T3FREE, THYROIDAB in the last 72 hours. Anemia Panel: No results for input(s): VITAMINB12, FOLATE, FERRITIN, TIBC, IRON, RETICCTPCT in the last 72 hours. Urine analysis:    Component Value Date/Time   COLORURINE YELLOW 05/30/2016 1200   APPEARANCEUR CLOUDY (A) 05/30/2016 1200   LABSPEC 1.024 05/30/2016 1200   PHURINE 7.0 05/30/2016 1200   GLUCOSEU NEGATIVE 05/30/2016 1200   HGBUR NEGATIVE 05/30/2016 1200   BILIRUBINUR NEGATIVE 05/30/2016 1200   KETONESUR >80 (A) 05/30/2016 1200   PROTEINUR NEGATIVE 05/30/2016 1200   NITRITE NEGATIVE 05/30/2016 1200   LEUKOCYTESUR NEGATIVE 05/30/2016 1200   Sepsis Labs: Invalid input(s): PROCALCITONIN, LACTICIDVEN  No results found for this or any previous visit (from the past 240 hour(s)).    Radiology Studies: Dg Chest 2 View  Result Date: 05/30/2016 CLINICAL DATA:  Abdominal and chest pain EXAM: CHEST  2 VIEW COMPARISON:  May 09, 2013 FINDINGS: The heart size and mediastinal contours are within normal limits. Both lungs are clear. The visualized skeletal structures are unremarkable. IMPRESSION: No active cardiopulmonary disease. Electronically Signed   By: Dorise Bullion III M.D   On: 05/30/2016 13:39   Dg Abdomen 1 View  Result Date: 05/30/2016 CLINICAL DATA:  Abdominal pain EXAM: ABDOMEN - 1 VIEW COMPARISON:  None. FINDINGS: The bowel gas pattern is normal. No radio-opaque calculi or other significant radiographic abnormality are seen. IMPRESSION: Negative. Electronically Signed   By: Dorise Bullion III M.D   On: 05/30/2016 13:39   US Abdomen Complete  Result Date: 05/30/2016 CLINICAL DATA:  Epigastric abdominal pain with elevated  lipase. EXAM: ABDOMEN ULTRASOUND COMPLETE COMPARISON:  None. FINDINGS: Gallbladder: No gallstones or gallbladder wall thickening. No pericholecystic fluid. The sonographer reports no sonographic Murphy's sign. Common bile duct: Diameter: 5 mm Liver: Echogenic parenchyma with poor acoustic through transmission. No focal abnormality evident. IVC: Obscured by overlying bowel gas. Pancreas: Visualized portion unremarkable. Spleen: Size and appearance within normal limits. Right Kidney: Length: 13.9 cm. Echogenicity within normal limits. No mass or hydronephrosis visualized. Left Kidney: Length: 13.0 cm. Echogenicity within normal limits. No mass or hydronephrosis visualized. Abdominal aorta: Obscured by overlying bowel gas. Other findings: None. IMPRESSION: Unremarkable abdominal ultrasound. If there is clinical concern for pancreatitis, CT imaging may prove helpful to further evaluate. Electronically Signed  By: Misty Stanley M.D.   On: 05/30/2016 15:59     Scheduled Meds: . sodium chloride  1,000 mL Intravenous Once  . enoxaparin (LOVENOX) injection  40 mg Subcutaneous Q24H  . famotidine (PEPCID) IV  20 mg Intravenous Q12H  . fenofibrate  160 mg Oral Daily  . metoCLOPramide  10 mg Oral Once  . pantoprazole  40 mg Oral Daily  . potassium chloride  10 mEq Intravenous Q1 Hr x 2  . potassium chloride  40 mEq Oral Q3H  . rOPINIRole  1 mg Oral QHS   Continuous Infusions:     Marzetta Board, MD, PhD [facility] Pager [phone]  If 7PM-7AM, please contact night-coverage [email or web address] Password [email or web address] 06/01/2016, 10:35 AM

## 2016-06-02 DIAGNOSIS — K859 Acute pancreatitis without necrosis or infection, unspecified: Principal | ICD-10-CM

## 2016-06-02 DIAGNOSIS — K858 Other acute pancreatitis without necrosis or infection: Secondary | ICD-10-CM

## 2016-06-02 DIAGNOSIS — K589 Irritable bowel syndrome without diarrhea: Secondary | ICD-10-CM

## 2016-06-02 DIAGNOSIS — E781 Pure hyperglyceridemia: Secondary | ICD-10-CM

## 2016-06-02 LAB — CBC
HCT: 35.4 % — ABNORMAL LOW (ref 36.0–46.0)
HEMOGLOBIN: 11.7 g/dL — AB (ref 12.0–15.0)
MCH: 29.3 pg (ref 26.0–34.0)
MCHC: 33.1 g/dL (ref 30.0–36.0)
MCV: 88.7 fL (ref 78.0–100.0)
PLATELETS: 191 10*3/uL (ref 150–400)
RBC: 3.99 MIL/uL (ref 3.87–5.11)
RDW: 15.3 % (ref 11.5–15.5)
WBC: 9 10*3/uL (ref 4.0–10.5)

## 2016-06-02 LAB — COMPREHENSIVE METABOLIC PANEL
ALBUMIN: 2.7 g/dL — AB (ref 3.5–5.0)
ALK PHOS: 36 U/L — AB (ref 38–126)
ALT: 18 U/L (ref 14–54)
ANION GAP: 8 (ref 5–15)
AST: 19 U/L (ref 15–41)
BUN: <5 mg/dL — ABNORMAL LOW (ref 6–20)
CALCIUM: 7.8 mg/dL — AB (ref 8.9–10.3)
CHLORIDE: 107 mmol/L (ref 101–111)
CO2: 22 mmol/L (ref 22–32)
Creatinine, Ser: 0.54 mg/dL (ref 0.44–1.00)
GFR calc Af Amer: >60 mL/min (ref >60)
GFR calc non Af Amer: >60 mL/min (ref >60)
GLUCOSE: 126 mg/dL — AB (ref 65–99)
POTASSIUM: 3.1 mmol/L — AB (ref 3.5–5.1)
SODIUM: 137 mmol/L (ref 135–145)
Total Bilirubin: 0.7 mg/dL (ref 0.3–1.2)
Total Protein: 5.7 g/dL — ABNORMAL LOW (ref 6.5–8.1)

## 2016-06-02 MED ORDER — POTASSIUM CHLORIDE IN NACL 20-0.9 MEQ/L-% IV SOLN
INTRAVENOUS | Status: DC
  Administered 2016-06-02: 16:00:00 via INTRAVENOUS
  Filled 2016-06-02 (×2): qty 1000

## 2016-06-02 MED ORDER — HYDROMORPHONE HCL 1 MG/ML IJ SOLN
0.5000 mg | INTRAMUSCULAR | Status: DC | PRN
  Administered 2016-06-02 – 2016-06-03 (×4): 0.5 mg via INTRAVENOUS
  Filled 2016-06-02 (×3): qty 1

## 2016-06-02 MED ORDER — POTASSIUM CHLORIDE CRYS ER 20 MEQ PO TBCR
40.0000 meq | EXTENDED_RELEASE_TABLET | Freq: Two times a day (BID) | ORAL | Status: AC
  Administered 2016-06-02 – 2016-06-03 (×3): 40 meq via ORAL
  Filled 2016-06-02 (×2): qty 2

## 2016-06-02 NOTE — Progress Notes (Signed)
PROGRESS NOTE  Diamond [name] [id number] DOB: 1966/05/05 DOA: 05/30/2016 PCP: Irven Shelling, MD   LOS: 2 days   Brief Narrative: Diamond [name] is a 50 y.o. female with medical history significant of IBS, reflux, hyperlipidemia, hypertension, presents to the emergency room with chief complaint of abdominal pain as well as nausea for the past 1-2 days, found to have acute pancreatitis due to hypertriglyceridemia   Assessment & Plan: Active Problems:   Pancreatitis   Acute pancreatitis   IBS (irritable bowel syndrome)   Hypertriglyceridemia  Acute pancreatitis - due to elevated TG - slowly advance diet - CT scan of the abdomen and pelvis with contrast: acute pancreatitis seen without complication  Hypertriglyceridemia   - insulin/d50, fluids x 2  - Continue fenofibrate, started on 9/11 - Triglycerides improving, 938 from 3000 on admission  GERD - resume PPI  HTN - hold HCTZ for now  Hypokalemia - replete again today IV and PO - check magnesium   DVT prophylaxis: Lovenox Code Status: Full Family Communication: no family bedside Disposition Plan: home when ready  Consultants:   None   Procedures:   None   Antimicrobials:  None    Subjective: - ongoing abdominal pain, relieved by pain medications however she's hurting when meds wear off - Overall she feels somewhat improved today, and had a better night  Objective: Vitals:   05/31/16 2055 06/01/16 1326 06/01/16 2214 06/02/16 0536  BP: 118/60 (!) 115/50 (!) 147/80 130/72  Pulse: 92 70 80 80  Resp: 18 18 16 16   Temp: 98.4 F (36.9 C) 98.2 F (36.8 C) 98.3 F (36.8 C) 97.9 F (36.6 C)  TempSrc: Oral Oral Oral Oral  SpO2: 94% 98% 98% 95%  Weight:      Height:        Intake/Output Summary (Last 24 hours) at 06/02/16 1445 Last data filed at 06/02/16 1033  Gross per 24 hour  Intake              700 ml  Output             2025 ml  Net            -1325 ml   Filed Weights   05/30/16 1110    Weight: 94.3 kg (208 lb)    Examination: Constitutional: NAD Vitals:   05/31/16 2055 06/01/16 1326 06/01/16 2214 06/02/16 0536  BP: 118/60 (!) 115/50 (!) 147/80 130/72  Pulse: 92 70 80 80  Resp: 18 18 16 16   Temp: 98.4 F (36.9 C) 98.2 F (36.8 C) 98.3 F (36.8 C) 97.9 F (36.6 C)  TempSrc: Oral Oral Oral Oral  SpO2: 94% 98% 98% 95%  Weight:      Height:       ENMT: Mucous membranes are moist. Neck: normal, supple Respiratory: clear to auscultation bilaterally, no wheezing, no crackles. Normal respiratory effort. No accessory muscle use.  Cardiovascular: Regular rate and rhythm, no murmurs / rubs / gallops. No LE edema. 2+ pedal pulses. No carotid bruits.  Abdomen: + epigastric tenderness. Bowel sounds positive.  Musculoskeletal: no clubbing / cyanosis.  Neuro: non focao    Data Reviewed: I have personally reviewed following labs and imaging studies  CBC:  Recent Labs Lab 05/30/16 1120 05/31/16 0420 06/02/16 0500  WBC 12.7* 12.3* 9.0  HGB 15.5* 13.6 11.7*  HCT 43.7 39.6 35.4*  MCV 87.2 86.7 88.7  PLT 231 206 [id number]   Basic Metabolic Panel:  Recent Labs Lab 05/30/16 1120 05/31/16  0420 06/01/16 0510 06/02/16 0500  NA 136 135 137 137  K 4.4 3.0* 2.9* 3.1*  CL 99* 97* 105 107  CO2 18* 27 22 22   GLUCOSE 161* 165* 160* 126*  BUN <5* <5* <5* <5*  CREATININE 0.65 0.53 0.66 0.54  CALCIUM 8.9 7.8* 7.5* 7.8*   GFR: Estimated Creatinine Clearance: 90.8 mL/min (by C-G formula based on SCr of 0.54 mg/dL). Liver Function Tests:  Recent Labs Lab 05/30/16 1120 05/31/16 0420 06/01/16 0510 06/02/16 0500  AST 63* 31 15 19   ALT 35 38 18 18  ALKPHOS 45 38 36* 36*  BILITOT 1.3* 0.8 0.6 0.7  PROT 6.5 6.3* 6.0* 5.7*  ALBUMIN 3.2* 3.2* 2.9* 2.7*    Recent Labs Lab 05/30/16 1120 05/31/16 0846  LIPASE 396* 104*   No results for input(s): AMMONIA in the last 168 hours. Coagulation Profile: No results for input(s): INR, PROTIME in the last 168 hours. Cardiac  Enzymes: No results for input(s): CKTOTAL, CKMB, CKMBINDEX, TROPONINI in the last 168 hours. BNP (last 3 results) No results for input(s): PROBNP in the last 8760 hours. HbA1C: No results for input(s): HGBA1C in the last 72 hours. CBG:  Recent Labs Lab 05/30/16 2120  GLUCAP 184*   Lipid Profile:  Recent Labs  05/31/16 0420 06/01/16 0510  TRIG 1,781* 938*   Thyroid Function Tests: No results for input(s): TSH, T4TOTAL, FREET4, T3FREE, THYROIDAB in the last 72 hours. Anemia Panel: No results for input(s): VITAMINB12, FOLATE, FERRITIN, TIBC, IRON, RETICCTPCT in the last 72 hours. Urine analysis:    Component Value Date/Time   COLORURINE YELLOW 05/30/2016 1200   APPEARANCEUR CLOUDY (A) 05/30/2016 1200   LABSPEC 1.024 05/30/2016 1200   PHURINE 7.0 05/30/2016 1200   GLUCOSEU NEGATIVE 05/30/2016 1200   HGBUR NEGATIVE 05/30/2016 1200   BILIRUBINUR NEGATIVE 05/30/2016 1200   KETONESUR >80 (A) 05/30/2016 1200   PROTEINUR NEGATIVE 05/30/2016 1200   NITRITE NEGATIVE 05/30/2016 1200   LEUKOCYTESUR NEGATIVE 05/30/2016 1200   Sepsis Labs: Invalid input(s): PROCALCITONIN, LACTICIDVEN  No results found for this or any previous visit (from the past 240 hour(s)).    Radiology Studies: Ct Abdomen Pelvis W Contrast  Result Date: 06/01/2016 CLINICAL DATA:  Abdominal pain nausea for 1-2 days, acute pancreatitis secondary to hypertriglyceridemia, history irritable bowel syndrome, reflux, hyperlipidemia, hypertension, endometriosis, smoker EXAM: CT ABDOMEN AND PELVIS WITH CONTRAST TECHNIQUE: Multidetector CT imaging of the abdomen and pelvis was performed using the standard protocol following bolus administration of intravenous contrast. Sagittal and coronal MPR images reconstructed from axial data set. CONTRAST:  154mL ISOVUE-300 IOPAMIDOL (ISOVUE-300) INJECTION 61% IV. Dilute oral contrast. COMPARISON:  01/02/2014 FINDINGS: Lower chest: Bibasilar atelectasis. Hepatobiliary: Focal fatty  infiltration of liver adjacent to falciform fissure. Liver and gallbladder otherwise unremarkable. Pancreas: Mild peripancreatic edema and ill definition of pancreatic margins compatible with pancreatitis. No mass or abnormal fluid collection. No calcifications seen. Adjacent vascular structures appear patent. Spleen: Normal appearance Adrenals/Urinary Tract: Adrenal glands normal appearance. Kidneys, ureters and bladder normal appearance. Stomach/Bowel: Normal appendix. Stomach and bowel loops normal appearance. Vascular/Lymphatic: Scattered normal size retroperitoneal nodes. No abdominal or pelvic adenopathy. Aorta normal caliber. Reproductive: Uterus surgically absent with prior LEFT oophorectomy per EHR. Question normal sized RIGHT ovary versus prominent vaginal cuff on RIGHT. Other: No free air or free fluid.  No hernia. Musculoskeletal: Unremarkable IMPRESSION: Mild changes of acute pancreatitis without acute complication. Bibasilar atelectasis. No other intra-abdominal or intrapelvic abnormalities. Electronically Signed   By: Lavonia Dana M.D.   On:  06/01/2016 17:23    Scheduled Meds: . sodium chloride  1,000 mL Intravenous Once  . enoxaparin (LOVENOX) injection  40 mg Subcutaneous Q24H  . famotidine (PEPCID) IV  20 mg Intravenous Q12H  . fenofibrate  160 mg Oral Daily  . metoCLOPramide  10 mg Oral Once  . pantoprazole  40 mg Oral Daily  . potassium chloride  40 mEq Oral BID  . rOPINIRole  1 mg Oral QHS   Continuous Infusions:   Irwin Brakeman, MD [facility] Pager [phone]  If 7PM-7AM, please contact night-coverage [email or web address] Password [email or web address] 06/02/2016, 2:45 PM

## 2016-06-03 DIAGNOSIS — E876 Hypokalemia: Secondary | ICD-10-CM

## 2016-06-03 LAB — COMPREHENSIVE METABOLIC PANEL
ALT: 22 U/L (ref 14–54)
AST: 24 U/L (ref 15–41)
Albumin: 3.2 g/dL — ABNORMAL LOW (ref 3.5–5.0)
Alkaline Phosphatase: 42 U/L (ref 38–126)
Anion gap: 10 (ref 5–15)
BUN: <5 mg/dL — ABNORMAL LOW (ref 6–20)
CHLORIDE: 106 mmol/L (ref 101–111)
CO2: 24 mmol/L (ref 22–32)
Calcium: 8.7 mg/dL — ABNORMAL LOW (ref 8.9–10.3)
Creatinine, Ser: 0.58 mg/dL (ref 0.44–1.00)
Glucose, Bld: 131 mg/dL — ABNORMAL HIGH (ref 65–99)
POTASSIUM: 3.3 mmol/L — AB (ref 3.5–5.1)
Sodium: 140 mmol/L (ref 135–145)
Total Bilirubin: 0.8 mg/dL (ref 0.3–1.2)
Total Protein: 6.8 g/dL (ref 6.5–8.1)

## 2016-06-03 LAB — LIPASE, BLOOD: LIPASE: 23 U/L (ref 11–51)

## 2016-06-03 LAB — MAGNESIUM: MAGNESIUM: 1.7 mg/dL (ref 1.7–2.4)

## 2016-06-03 MED ORDER — POTASSIUM CHLORIDE CRYS ER 20 MEQ PO TBCR: 40.0000 meq | EXTENDED_RELEASE_TABLET | Freq: Once | ORAL | Status: DC

## 2016-06-03 MED ORDER — HYDROCHLOROTHIAZIDE 12.5 MG PO TABS: 12.5000 mg | ORAL_TABLET | Freq: Every morning | ORAL | 0 refills | Status: DC

## 2016-06-03 MED ORDER — HYDROCODONE-ACETAMINOPHEN 5-325 MG PO TABS: 1.0000 | ORAL_TABLET | Freq: Four times a day (QID) | ORAL | 0 refills | Status: DC | PRN

## 2016-06-03 MED ORDER — POTASSIUM CHLORIDE CRYS ER 20 MEQ PO TBCR: 20.0000 meq | EXTENDED_RELEASE_TABLET | Freq: Every day | ORAL | 0 refills | Status: DC

## 2016-06-03 MED ORDER — FENOFIBRATE 160 MG PO TABS: 160.0000 mg | ORAL_TABLET | Freq: Every day | ORAL | 0 refills | Status: AC

## 2016-06-03 NOTE — Discharge Instructions (Signed)
Acute Pancreatitis Acute pancreatitis is a disease in which the pancreas becomes suddenly irritated (inflamed). The pancreas is a large gland behind your stomach. The pancreas makes enzymes that help digest food. The pancreas also makes 2 hormones that help control your blood sugar. Acute pancreatitis happens when the enzymes attack and damage the pancreas. Most attacks last a couple of days and can cause serious problems. HOME CARE  Follow your doctor's diet instructions. You may need to avoid alcohol and limit fat in your diet.  Eat small meals often.  Drink enough fluids to keep your pee (urine) clear or pale yellow.  Only take medicines as told by your doctor.  Avoid drinking alcohol if it caused your disease.  Do not smoke.  Get plenty of rest.  Check your blood sugar at home as told by your doctor.  Keep all doctor visits as told. GET HELP IF:  You do not get better as quickly as expected.  You have new or worsening symptoms.  You have lasting pain, weakness, or feel sick to your stomach (nauseous).  You get better and then have another pain attack. GET HELP RIGHT AWAY IF:   You are unable to eat or keep fluids down.  Your pain becomes severe.  You have a fever or lasting symptoms for more than 2 to 3 days.  You have a fever and your symptoms suddenly get worse.  Your skin or the white part of your eyes turn yellow (jaundice).  You throw up (vomit).  You feel dizzy, or you pass out (faint).  Your blood sugar is high (over 300 mg/dL). MAKE SURE YOU:   Understand these instructions.  Will watch your condition.  Will get help right away if you are not doing well or get worse.   This information is not intended to replace advice given to you by your health care provider. Make sure you discuss any questions you have with your health care provider.   Document Released: 02/23/2008 Document Revised: 09/27/2014 Document Reviewed: 12/16/2011 Elsevier Interactive  Patient Education 2016 Elsevier Inc.  Lipase Test WHY AM I HAVING THIS TEST? The lipase test is used to measure lipase levels in your blood. Lipase is a protein (enzyme) that is released by your pancreas into the small intestine to help with triglyceride digestion. Your health care provider may want you to have this test done if he or she suspects you have damage to or infection of your pancreas (pancreatitis). The test is often done along with other blood tests. Other reasons you may have this test include:  To monitor your kidney function if you have kidney disease or kidney failure.  To assist with the diagnosis of intestinal injury, disease, or obstruction.  To help check for inflammation or infection of your gallbladder.  To assist in the evaluation for peptic ulcer disease. WHAT KIND OF SAMPLE IS TAKEN? A blood sample is required for this test. It is usually collected by inserting a needle into a vein. HOW DO I PREPARE FOR THE TEST? You may be asked to avoid eating or drinking anything except water for 8-12 hours before the test. In addition, there are several factors that can affect the results of a lipase test, including certain medicines. Let your health care provider know if you are taking any of the following medicines:  Bethanechol.  Cholinergics.  Codeine.  Indomethacin.  Meperidine.  Methacholine.  Morphine.  Calcium supplements. WHAT ARE THE REFERENCE RANGES? Reference ranges are considered healthy ranges established  after testing a large group of healthy people. Reference ranges may vary among different people, labs, and hospitals. It is your responsibility to obtain your test results. Ask the lab or department performing the test when and how you will get your results. The reference range for this test is 0-160 units/L or 0-160 units/L (SI units). WHAT DO THE RESULTS MEAN? Lipase levels that are higher than the reference range can indicate many health  conditions. These may include:  Disease of the pancreas.  Disease of the gallbladder.  Kidney failure.  Bowel obstruction or infarction, which means death of tissue due to a lack of blood supply.  Swelling or infection of salivary glands.  Peptic ulcer disease. Talk with your health care provider to discuss your results, treatment options, and if necessary, the need for more tests. Talk with your health care provider if you have any questions about your results.   This information is not intended to replace advice given to you by your health care provider. Make sure you discuss any questions you have with your health care provider.   Document Released: 10/01/2004 Document Revised: 09/27/2014 Document Reviewed: 01/24/2014 Elsevier Interactive Patient Education 2016 [facility] Diet for Pancreatitis or Gallbladder Conditions A low-fat diet can be helpful if you have pancreatitis or a gallbladder condition. With these conditions, your pancreas and gallbladder have trouble digesting fats. A healthy eating plan with less fat will help rest your pancreas and gallbladder and reduce your symptoms. WHAT DO I NEED TO KNOW ABOUT THIS DIET?  Eat a low-fat diet.  Reduce your fat intake to less than 20-30% of your total daily calories. This is less than 50-60 g of fat per day.  Remember that you need some fat in your diet. Ask your dietician what your daily goal should be.  Choose nonfat and low-fat healthy foods. Look for the words "nonfat," "low fat," or "fat free."  As a guide, look on the label and choose foods with less than 3 g of fat per serving. Eat only one serving.  Avoid alcohol.  Do not smoke. If you need help quitting, talk with your health care provider.  Eat small frequent meals instead of three large heavy meals. WHAT FOODS CAN I EAT? Grains Include healthy grains and starches such as potatoes, wheat bread, fiber-rich cereal, and brown rice. Choose whole grain  options whenever possible. In adults, whole grains should account for 45-65% of your daily calories.  Fruits and Vegetables Eat plenty of fruits and vegetables. Fresh fruits and vegetables add fiber to your diet. Meats and Other Protein Sources Eat lean meat such as chicken and pork. Trim any fat off of meat before cooking it. Eggs, fish, and beans are other sources of protein. In adults, these foods should account for 10-35% of your daily calories. Dairy Choose low-fat milk and dairy options. Dairy includes fat and protein, as well as calcium.  Fats and Oils Limit high-fat foods such as fried foods, sweets, baked goods, sugary drinks.  Other Creamy sauces and condiments, such as mayonnaise, can add extra fat. Think about whether or not you need to use them, or use smaller amounts or low fat options. WHAT FOODS ARE NOT RECOMMENDED?  High fat foods, such as:  [employer].  Ice cream.  [place] toast.  Sweet rolls.  Pizza.  Cheese bread.  Foods covered with batter, butter, creamy sauces, or cheese.  Fried foods.  Sugary drinks and desserts.  Foods that cause gas or bloating  This information is not intended to replace advice given to you by your health care provider. Make sure you discuss any questions you have with your health care provider.   Document Released: 09/11/2013 Document Reviewed: 09/11/2013 Elsevier Interactive Patient Education 2016 [facility] Choices to Lower Your Triglycerides Triglycerides are a type of fat in your blood. High levels of triglycerides can increase the risk of heart disease and stroke. If your triglyceride levels are high, the foods you eat and your eating habits are very important. Choosing the right foods can help lower your triglycerides.  WHAT GENERAL GUIDELINES DO I NEED TO FOLLOW?  Lose weight if you are overweight.   Limit or avoid alcohol.   Fill one half of your plate with vegetables and green salads.   Limit fruit  to two servings a day. Choose fruit instead of juice.   Make one fourth of your plate whole grains. Look for the word "whole" as the first word in the ingredient list.  Fill one fourth of your plate with lean protein foods.  Enjoy fatty fish (such as salmon, mackerel, sardines, and tuna) three times a week.   Choose healthy fats.   Limit foods high in starch and sugar.  Eat more home-cooked food and less restaurant, buffet, and fast food.  Limit fried foods.  Cook foods using methods other than frying.  Limit saturated fats.  Check ingredient lists to avoid foods with partially hydrogenated oils (trans fats) in them. WHAT FOODS CAN I EAT?  Grains Whole grains, such as whole wheat or whole grain breads, crackers, cereals, and pasta. Unsweetened oatmeal, bulgur, barley, quinoa, or brown rice. Corn or whole wheat flour tortillas.  Vegetables Fresh or frozen vegetables (raw, steamed, roasted, or grilled). Green salads. Fruits All fresh, canned (in natural juice), or frozen fruits. Meat and Other Protein Products Ground beef (85% or leaner), grass-fed beef, or beef trimmed of fat. Skinless chicken or [place]. Ground chicken or [place]. Pork trimmed of fat. All fish and seafood. Eggs. Dried beans, peas, or lentils. Unsalted nuts or seeds. Unsalted canned or dry beans. Dairy Low-fat dairy products, such as skim or 1% milk, 2% or reduced-fat cheeses, low-fat ricotta or cottage cheese, or plain low-fat yogurt. Fats and Oils Tub margarines without trans fats. Light or reduced-fat mayonnaise and salad dressings. Avocado. Safflower, olive, or canola oils. Natural peanut or almond butter. The items listed above may not be a complete list of recommended foods or beverages. Contact your dietitian for more options. WHAT FOODS ARE NOT RECOMMENDED?  Grains White bread. White pasta. White rice. Cornbread. Bagels, pastries, and croissants. Crackers that contain trans fat. Vegetables White  potatoes. Corn. Creamed or fried vegetables. Vegetables in a cheese sauce. Fruits Dried fruits. Canned fruit in light or heavy syrup. Fruit juice. Meat and Other Protein Products Fatty cuts of meat. Ribs, chicken wings, bacon, sausage, bologna, salami, chitterlings, fatback, hot dogs, bratwurst, and packaged luncheon meats. Dairy Whole or 2% milk, cream, half-and-half, and cream cheese. Whole-fat or sweetened yogurt. Full-fat cheeses. Nondairy creamers and whipped toppings. Processed cheese, cheese spreads, or cheese curds. Sweets and Desserts Corn syrup, sugars, honey, and molasses. Candy. Jam and jelly. Syrup. Sweetened cereals. Cookies, pies, cakes, donuts, muffins, and ice cream. Fats and Oils Butter, stick margarine, lard, shortening, ghee, or bacon fat. Coconut, palm kernel, or palm oils. Beverages Alcohol. Sweetened drinks (such as sodas, lemonade, and fruit drinks or punches). The items listed above may not be a complete list of  foods and beverages to avoid. Contact your dietitian for more information.   This information is not intended to replace advice given to you by your health care provider. Make sure you discuss any questions you have with your health care provider.   Document Released: 06/24/2004 Document Revised: 09/27/2014 Document Reviewed: 07/11/2013 Elsevier Interactive Patient Education [employer].

## 2016-06-03 NOTE — Discharge Summary (Signed)
Physician Discharge Summary  Diamond [name] DOB: Jun 17, 1966 DOA: 05/30/2016  PCP: Irven Shelling, MD  Admit date: 05/30/2016 Discharge date: 06/03/2016  Disposition:  Home  Recommendations for Outpatient Follow-up:  1. Follow up with PCP in 1 weeks 2. Please obtain Fasting lipid panel in 1-2 weeks to follow triglycerides  Discharge Condition: Stable  CODE STATUS: FULL  Diet recommendation: Low Fat Diet  Brief/Interim Summary: Diamond R Hashemis a 50 y.o.femalewith medical history significant of IBS, reflux, hyperlipidemia, hypertension, presents to the emergency room with chief complaint of abdominal pain as well as nausea for the past 1-2 days, found to have acute pancreatitis due to hypertriglyceridemia   Assessment & Plan: Active Problems:   Pancreatitis   Acute pancreatitis   IBS (irritable bowel syndrome)   Hypertriglyceridemia  Acute pancreatitis - suspect this was all due to elevated TG, US abdomen unremarkable - slowly advanced diet and tolerating low fat diet - CT scan of the abdomen and pelvis with contrast: acute pancreatitis seen without complication  Hypertriglyceridemia   - insulin/d50, fluids x 2  - Continue fenofibrate, started on 9/11 - Triglycerides improving, 938 from 3000 on admission  GERD - resume PPI  HTN - resume lower dose HCTZ 12.5 mg daily with potassium supplement  Hypokalemia - repleted IV and PO - continue daily potassium replacement at discharge  DVT prophylaxis: Lovenox Code Status: Full Family Communication: at bedside Disposition Plan: home   Discharge Diagnoses:  Active Problems:   Pancreatitis   Acute pancreatitis   IBS (irritable bowel syndrome)   Hypertriglyceridemia  Discharge Instructions  Discharge Instructions    Diet - low sodium heart healthy    Complete by:  As directed    Increase activity slowly    Complete by:  As directed        Medication List    STOP taking these medications    Biotin 5000 MCG Tabs   Cinnamon 500 MG Tabs     TAKE these medications   Cod Liver Oil Caps Take 1 capsule by mouth every morning.   estradiol 2 MG tablet Commonly known as:  ESTRACE Take 2 mg by mouth at bedtime.   fenofibrate 160 MG tablet Take 1 tablet (160 mg total) by mouth daily. Start taking on:  06/04/2016   Flax Seed Oil 1000 MG Caps Take 1,000 mg by mouth daily.   furosemide 20 MG tablet Commonly known as:  LASIX Take 20 mg by mouth daily as needed for fluid.   Garlic 123XX123 MG Caps Take 1,000 mg by mouth daily.   hydrochlorothiazide 12.5 MG tablet Commonly known as:  HYDRODIURIL Take 1 tablet (12.5 mg total) by mouth every morning. What changed:  medication strength  how much to take   HYDROcodone-acetaminophen 5-325 MG tablet Commonly known as:  NORCO/VICODIN Take 1 tablet by mouth every 6 (six) hours as needed for moderate pain or severe pain. What changed:  how much to take   omeprazole 40 MG capsule Commonly known as:  PRILOSEC Take 40 mg by mouth every morning.   potassium chloride SA 20 MEQ tablet Commonly known as:  K-DUR,KLOR-CON Take 1 tablet (20 mEq total) by mouth daily. What changed:  when to take this  reasons to take this   rOPINIRole 1 MG tablet Commonly known as:  REQUIP Take 1 mg by mouth at bedtime.   WOMENS 50+ ADVANCED Caps Take 1 tablet by mouth daily.   zolpidem 10 MG tablet Commonly known as:  AMBIEN Take 5  mg by mouth at bedtime.       Allergies  Allergen Reactions  . Paroxetine Hcl Other (See Comments)    Suicidal thoughts    Procedures/Studies: Dg Chest 2 View  Result Date: 05/30/2016 CLINICAL DATA:  Abdominal and chest pain EXAM: CHEST  2 VIEW COMPARISON:  May 09, 2013 FINDINGS: The heart size and mediastinal contours are within normal limits. Both lungs are clear. The visualized skeletal structures are unremarkable. IMPRESSION: No active cardiopulmonary disease. Electronically Signed   By: Dorise Bullion III M.D   On: 05/30/2016 13:39   Dg Abdomen 1 View  Result Date: 05/30/2016 CLINICAL DATA:  Abdominal pain EXAM: ABDOMEN - 1 VIEW COMPARISON:  None. FINDINGS: The bowel gas pattern is normal. No radio-opaque calculi or other significant radiographic abnormality are seen. IMPRESSION: Negative. Electronically Signed   By: Dorise Bullion III M.D   On: 05/30/2016 13:39   US Abdomen Complete  Result Date: 05/30/2016 CLINICAL DATA:  Epigastric abdominal pain with elevated lipase. EXAM: ABDOMEN ULTRASOUND COMPLETE COMPARISON:  None. FINDINGS: Gallbladder: No gallstones or gallbladder wall thickening. No pericholecystic fluid. The sonographer reports no sonographic Murphy's sign. Common bile duct: Diameter: 5 mm Liver: Echogenic parenchyma with poor acoustic through transmission. No focal abnormality evident. IVC: Obscured by overlying bowel gas. Pancreas: Visualized portion unremarkable. Spleen: Size and appearance within normal limits. Right Kidney: Length: 13.9 cm. Echogenicity within normal limits. No mass or hydronephrosis visualized. Left Kidney: Length: 13.0 cm. Echogenicity within normal limits. No mass or hydronephrosis visualized. Abdominal aorta: Obscured by overlying bowel gas. Other findings: None. IMPRESSION: Unremarkable abdominal ultrasound. If there is clinical concern for pancreatitis, CT imaging may prove helpful to further evaluate. Electronically Signed   By: Misty Stanley M.D.   On: 05/30/2016 15:59   Ct Abdomen Pelvis W Contrast  Result Date: 06/01/2016 CLINICAL DATA:  Abdominal pain nausea for 1-2 days, acute pancreatitis secondary to hypertriglyceridemia, history irritable bowel syndrome, reflux, hyperlipidemia, hypertension, endometriosis, smoker EXAM: CT ABDOMEN AND PELVIS WITH CONTRAST TECHNIQUE: Multidetector CT imaging of the abdomen and pelvis was performed using the standard protocol following bolus administration of intravenous contrast. Sagittal and coronal MPR images  reconstructed from axial data set. CONTRAST:  135mL ISOVUE-300 IOPAMIDOL (ISOVUE-300) INJECTION 61% IV. Dilute oral contrast. COMPARISON:  01/02/2014 FINDINGS: Lower chest: Bibasilar atelectasis. Hepatobiliary: Focal fatty infiltration of liver adjacent to falciform fissure. Liver and gallbladder otherwise unremarkable. Pancreas: Mild peripancreatic edema and ill definition of pancreatic margins compatible with pancreatitis. No mass or abnormal fluid collection. No calcifications seen. Adjacent vascular structures appear patent. Spleen: Normal appearance Adrenals/Urinary Tract: Adrenal glands normal appearance. Kidneys, ureters and bladder normal appearance. Stomach/Bowel: Normal appendix. Stomach and bowel loops normal appearance. Vascular/Lymphatic: Scattered normal size retroperitoneal nodes. No abdominal or pelvic adenopathy. Aorta normal caliber. Reproductive: Uterus surgically absent with prior LEFT oophorectomy per EHR. Question normal sized RIGHT ovary versus prominent vaginal cuff on RIGHT. Other: No free air or free fluid.  No hernia. Musculoskeletal: Unremarkable IMPRESSION: Mild changes of acute pancreatitis without acute complication. Bibasilar atelectasis. No other intra-abdominal or intrapelvic abnormalities. Electronically Signed   By: Lavonia Dana M.D.   On: 06/01/2016 17:23    Subjective: Pt eating much better and ambulating well, No n/v/d, drinking well  Discharge Exam: Vitals:   06/03/16 0557 06/03/16 1400  BP: (!) 146/66 136/68  Pulse: 84 80  Resp: 16 18  Temp: 97.8 F (36.6 C) 98.2 F (36.8 C)   Vitals:   06/02/16 0536 06/02/16 2114 06/03/16 0557  06/03/16 1400  BP: 130/72 (!) 146/64 (!) 146/66 136/68  Pulse: 80 74 84 80  Resp: 16 16 16 18   Temp: 97.9 F (36.6 C) 98.4 F (36.9 C) 97.8 F (36.6 C) 98.2 F (36.8 C)  TempSrc: Oral Oral Oral Oral  SpO2: 95% 98% 93% 97%  Weight:      Height:       ENMT: Mucous membranes are moist. Neck: normal, supple Respiratory:  clear to auscultation bilaterally, no wheezing, no crackles. Normal respiratory effort. No accessory muscle use.  Cardiovascular: Regular rate and rhythm, no murmurs / rubs / gallops. No LE edema. 2+ pedal pulses. No carotid bruits.  Abdomen: soft, normal BS, no guarding, no rebound tenderness. Bowel sounds positive.  Musculoskeletal: no clubbing / cyanosis.  Neuro: non focal  The results of significant diagnostics from this hospitalization (including imaging, microbiology, ancillary and laboratory) are listed below for reference.     Microbiology: No results found for this or any previous visit (from the past 240 hour(s)).   Labs: BNP (last 3 results) No results for input(s): BNP in the last 8760 hours. Basic Metabolic Panel:  Recent Labs Lab 05/30/16 1120 05/31/16 0420 06/01/16 0510 06/02/16 0500 06/03/16 0445  NA 136 135 137 137 140  K 4.4 3.0* 2.9* 3.1* 3.3*  CL 99* 97* 105 107 106  CO2 18* 27 22 22 24   GLUCOSE 161* 165* 160* 126* 131*  BUN <5* <5* <5* <5* <5*  CREATININE 0.65 0.53 0.66 0.54 0.58  CALCIUM 8.9 7.8* 7.5* 7.8* 8.7*  MG  --   --   --   --  1.7   Liver Function Tests:  Recent Labs Lab 05/30/16 1120 05/31/16 0420 06/01/16 0510 06/02/16 0500 06/03/16 0445  AST 63* 31 15 19 24   ALT 35 38 18 18 22   ALKPHOS 45 38 36* 36* 42  BILITOT 1.3* 0.8 0.6 0.7 0.8  PROT 6.5 6.3* 6.0* 5.7* 6.8  ALBUMIN 3.2* 3.2* 2.9* 2.7* 3.2*    Recent Labs Lab 05/30/16 1120 05/31/16 0846 06/03/16 0445  LIPASE 396* 104* 23   No results for input(s): AMMONIA in the last 168 hours. CBC:  Recent Labs Lab 05/30/16 1120 05/31/16 0420 06/02/16 0500  WBC 12.7* 12.3* 9.0  HGB 15.5* 13.6 11.7*  HCT 43.7 39.6 35.4*  MCV 87.2 86.7 88.7  PLT 231 206 191   Cardiac Enzymes: No results for input(s): CKTOTAL, CKMB, CKMBINDEX, TROPONINI in the last 168 hours. BNP: Invalid input(s): POCBNP CBG:  Recent Labs Lab 05/30/16 2120  GLUCAP 184*   D-Dimer No results for  input(s): DDIMER in the last 72 hours. Hgb A1c No results for input(s): HGBA1C in the last 72 hours. Lipid Profile  Recent Labs  06/01/16 0510  TRIG 938*   Thyroid function studies No results for input(s): TSH, T4TOTAL, T3FREE, THYROIDAB in the last 72 hours.  Invalid input(s): FREET3 Anemia work up No results for input(s): VITAMINB12, FOLATE, FERRITIN, TIBC, IRON, RETICCTPCT in the last 72 hours. Urinalysis    Component Value Date/Time   COLORURINE YELLOW 05/30/2016 1200   APPEARANCEUR CLOUDY (A) 05/30/2016 1200   LABSPEC 1.024 05/30/2016 1200   PHURINE 7.0 05/30/2016 1200   GLUCOSEU NEGATIVE 05/30/2016 1200   HGBUR NEGATIVE 05/30/2016 1200   BILIRUBINUR NEGATIVE 05/30/2016 1200   KETONESUR >80 (A) 05/30/2016 1200   PROTEINUR NEGATIVE 05/30/2016 1200   NITRITE NEGATIVE 05/30/2016 1200   LEUKOCYTESUR NEGATIVE 05/30/2016 1200   Sepsis Labs Invalid input(s): PROCALCITONIN,  WBC,  LACTICIDVEN Microbiology  No results found for this or any previous visit (from the past 240 hour(s)).  Time coordinating discharge: 32 minutes  SIGNED:  Irwin Brakeman, MD  [facility] 06/03/2016, 2:42 PM Pager   If 7PM-7AM, please contact night-coverage [email or web address] Password [email or web address]

## 2016-06-03 NOTE — Progress Notes (Signed)
Discharge instructions discussed with patient, verbalized agreement and understanding, prescription given to patient 

## 2016-11-02 ENCOUNTER — Ambulatory Visit
Admission: RE | Admit: 2016-11-02 | Discharge: 2016-11-02 | Disposition: A | Discharge status: Home / Self Care | Payer: 59 | Source: Ambulatory Visit | Attending: Nurse Practitioner | Admitting: Nurse Practitioner

## 2016-11-02 ENCOUNTER — Other Ambulatory Visit: Payer: Self-pay | Admitting: Nurse Practitioner

## 2016-11-02 DIAGNOSIS — M79674 Pain in right toe(s): Secondary | ICD-10-CM

## 2016-11-02 DIAGNOSIS — M79644 Pain in right finger(s): Secondary | ICD-10-CM | POA: Diagnosis not present

## 2016-11-02 DIAGNOSIS — S6991XA Unspecified injury of right wrist, hand and finger(s), initial encounter: Secondary | ICD-10-CM | POA: Diagnosis not present

## 2016-11-16 DIAGNOSIS — Z01419 Encounter for gynecological examination (general) (routine) without abnormal findings: Secondary | ICD-10-CM | POA: Diagnosis not present

## 2016-11-18 ENCOUNTER — Other Ambulatory Visit: Payer: Self-pay | Admitting: Obstetrics & Gynecology

## 2016-11-18 DIAGNOSIS — R2232 Localized swelling, mass and lump, left upper limb: Secondary | ICD-10-CM

## 2016-11-23 ENCOUNTER — Ambulatory Visit
Admission: RE | Admit: 2016-11-23 | Discharge: 2016-11-23 | Disposition: A | Discharge status: Home / Self Care | Payer: 59 | Source: Ambulatory Visit | Attending: Obstetrics & Gynecology | Admitting: Obstetrics & Gynecology

## 2016-11-23 DIAGNOSIS — R2232 Localized swelling, mass and lump, left upper limb: Secondary | ICD-10-CM

## 2016-11-23 DIAGNOSIS — N6489 Other specified disorders of breast: Secondary | ICD-10-CM | POA: Diagnosis not present

## 2016-11-23 DIAGNOSIS — R922 Inconclusive mammogram: Secondary | ICD-10-CM | POA: Diagnosis not present

## 2016-11-23 HISTORY — DX: Unspecified lump in unspecified breast: N63.0

## 2017-01-27 DIAGNOSIS — K219 Gastro-esophageal reflux disease without esophagitis: Secondary | ICD-10-CM | POA: Diagnosis not present

## 2017-01-27 DIAGNOSIS — E119 Type 2 diabetes mellitus without complications: Secondary | ICD-10-CM | POA: Diagnosis not present

## 2017-01-27 DIAGNOSIS — E1165 Type 2 diabetes mellitus with hyperglycemia: Secondary | ICD-10-CM | POA: Diagnosis not present

## 2017-01-27 DIAGNOSIS — I10 Essential (primary) hypertension: Secondary | ICD-10-CM | POA: Diagnosis not present

## 2017-04-20 DIAGNOSIS — G2581 Restless legs syndrome: Secondary | ICD-10-CM | POA: Diagnosis not present

## 2017-04-20 DIAGNOSIS — E781 Pure hyperglyceridemia: Secondary | ICD-10-CM | POA: Diagnosis not present

## 2017-04-20 DIAGNOSIS — I10 Essential (primary) hypertension: Secondary | ICD-10-CM | POA: Diagnosis not present

## 2017-04-20 DIAGNOSIS — E119 Type 2 diabetes mellitus without complications: Secondary | ICD-10-CM | POA: Diagnosis not present

## 2017-04-20 DIAGNOSIS — L749 Eccrine sweat disorder, unspecified: Secondary | ICD-10-CM | POA: Diagnosis not present

## 2017-04-20 DIAGNOSIS — E1165 Type 2 diabetes mellitus with hyperglycemia: Secondary | ICD-10-CM | POA: Diagnosis not present

## 2017-04-28 DIAGNOSIS — R110 Nausea: Secondary | ICD-10-CM | POA: Diagnosis not present

## 2017-04-28 DIAGNOSIS — R140 Abdominal distension (gaseous): Secondary | ICD-10-CM | POA: Diagnosis not present

## 2017-08-02 DIAGNOSIS — E119 Type 2 diabetes mellitus without complications: Secondary | ICD-10-CM | POA: Diagnosis not present

## 2017-08-02 DIAGNOSIS — I10 Essential (primary) hypertension: Secondary | ICD-10-CM | POA: Diagnosis not present

## 2017-08-02 DIAGNOSIS — Z23 Encounter for immunization: Secondary | ICD-10-CM | POA: Diagnosis not present

## 2017-08-02 DIAGNOSIS — K580 Irritable bowel syndrome with diarrhea: Secondary | ICD-10-CM | POA: Diagnosis not present

## 2017-09-23 DIAGNOSIS — K3184 Gastroparesis: Secondary | ICD-10-CM | POA: Diagnosis not present

## 2017-09-23 DIAGNOSIS — K580 Irritable bowel syndrome with diarrhea: Secondary | ICD-10-CM | POA: Diagnosis not present

## 2017-09-23 DIAGNOSIS — R110 Nausea: Secondary | ICD-10-CM | POA: Diagnosis not present

## 2017-09-26 DIAGNOSIS — K580 Irritable bowel syndrome with diarrhea: Secondary | ICD-10-CM | POA: Diagnosis not present

## 2017-10-03 DIAGNOSIS — K580 Irritable bowel syndrome with diarrhea: Secondary | ICD-10-CM | POA: Diagnosis not present

## 2017-10-31 DIAGNOSIS — E119 Type 2 diabetes mellitus without complications: Secondary | ICD-10-CM | POA: Diagnosis not present

## 2017-10-31 DIAGNOSIS — I10 Essential (primary) hypertension: Secondary | ICD-10-CM | POA: Diagnosis not present

## 2017-10-31 DIAGNOSIS — G2581 Restless legs syndrome: Secondary | ICD-10-CM | POA: Diagnosis not present

## 2018-01-31 DIAGNOSIS — K219 Gastro-esophageal reflux disease without esophagitis: Secondary | ICD-10-CM | POA: Diagnosis not present

## 2018-01-31 DIAGNOSIS — I10 Essential (primary) hypertension: Secondary | ICD-10-CM | POA: Diagnosis not present

## 2018-01-31 DIAGNOSIS — E781 Pure hyperglyceridemia: Secondary | ICD-10-CM | POA: Diagnosis not present

## 2018-01-31 DIAGNOSIS — E782 Mixed hyperlipidemia: Secondary | ICD-10-CM | POA: Diagnosis not present

## 2018-01-31 DIAGNOSIS — E119 Type 2 diabetes mellitus without complications: Secondary | ICD-10-CM | POA: Diagnosis not present

## 2018-01-31 DIAGNOSIS — Z0000 Encounter for general adult medical examination without abnormal findings: Secondary | ICD-10-CM | POA: Diagnosis not present

## 2018-02-22 DIAGNOSIS — Z6837 Body mass index (BMI) 37.0-37.9, adult: Secondary | ICD-10-CM | POA: Diagnosis not present

## 2018-02-22 DIAGNOSIS — Z01419 Encounter for gynecological examination (general) (routine) without abnormal findings: Secondary | ICD-10-CM | POA: Diagnosis not present

## 2018-03-02 DIAGNOSIS — E781 Pure hyperglyceridemia: Secondary | ICD-10-CM | POA: Diagnosis not present

## 2018-03-08 DIAGNOSIS — E119 Type 2 diabetes mellitus without complications: Secondary | ICD-10-CM | POA: Diagnosis not present

## 2018-06-02 DIAGNOSIS — Z23 Encounter for immunization: Secondary | ICD-10-CM | POA: Diagnosis not present

## 2018-06-02 DIAGNOSIS — K219 Gastro-esophageal reflux disease without esophagitis: Secondary | ICD-10-CM | POA: Diagnosis not present

## 2018-06-02 DIAGNOSIS — E1169 Type 2 diabetes mellitus with other specified complication: Secondary | ICD-10-CM | POA: Diagnosis not present

## 2018-06-02 DIAGNOSIS — I10 Essential (primary) hypertension: Secondary | ICD-10-CM | POA: Diagnosis not present

## 2018-06-02 DIAGNOSIS — E781 Pure hyperglyceridemia: Secondary | ICD-10-CM | POA: Diagnosis not present

## 2018-10-06 DIAGNOSIS — I10 Essential (primary) hypertension: Secondary | ICD-10-CM | POA: Diagnosis not present

## 2018-10-06 DIAGNOSIS — E1165 Type 2 diabetes mellitus with hyperglycemia: Secondary | ICD-10-CM | POA: Diagnosis not present

## 2018-10-06 DIAGNOSIS — E1169 Type 2 diabetes mellitus with other specified complication: Secondary | ICD-10-CM | POA: Diagnosis not present

## 2018-10-17 DIAGNOSIS — K3184 Gastroparesis: Secondary | ICD-10-CM | POA: Diagnosis not present

## 2018-10-17 DIAGNOSIS — K589 Irritable bowel syndrome without diarrhea: Secondary | ICD-10-CM | POA: Diagnosis not present

## 2018-10-17 DIAGNOSIS — K625 Hemorrhage of anus and rectum: Secondary | ICD-10-CM | POA: Diagnosis not present

## 2018-11-18 IMAGING — MG 2D DIGITAL DIAGNOSTIC BILATERAL MAMMOGRAM WITH CAD AND ADJUNCT T
8 of 15 series · 8 of 35 positions shown · non-contrast
Comparison: Previous exam(s).

CLINICAL DATA: 51-year-old female with a palpable abnormality in
the left axilla.

EXAM:
2D DIGITAL DIAGNOSTIC BILATERAL MAMMOGRAM WITH CAD AND ADJUNCT TOMO
ULTRASOUND LEFT AXILLA

[L CC]
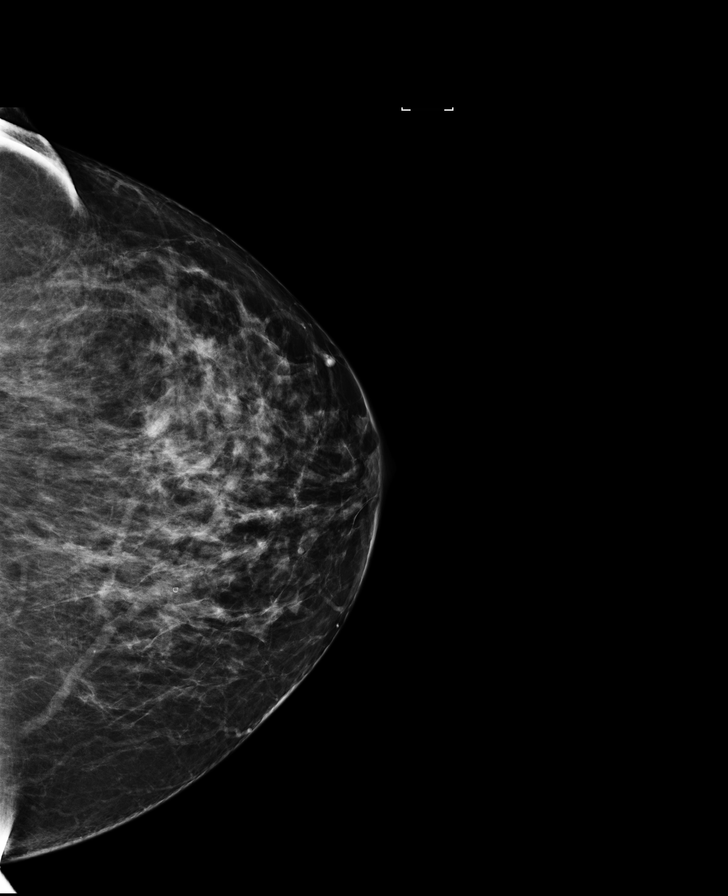

[L MLO synth-2D]
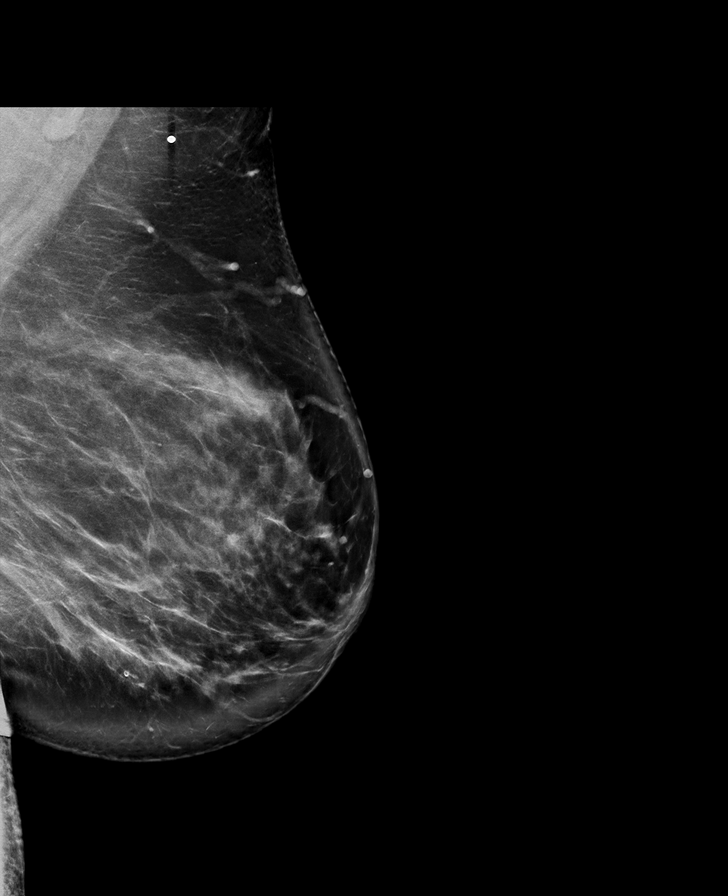

[L TAN synth-2D]
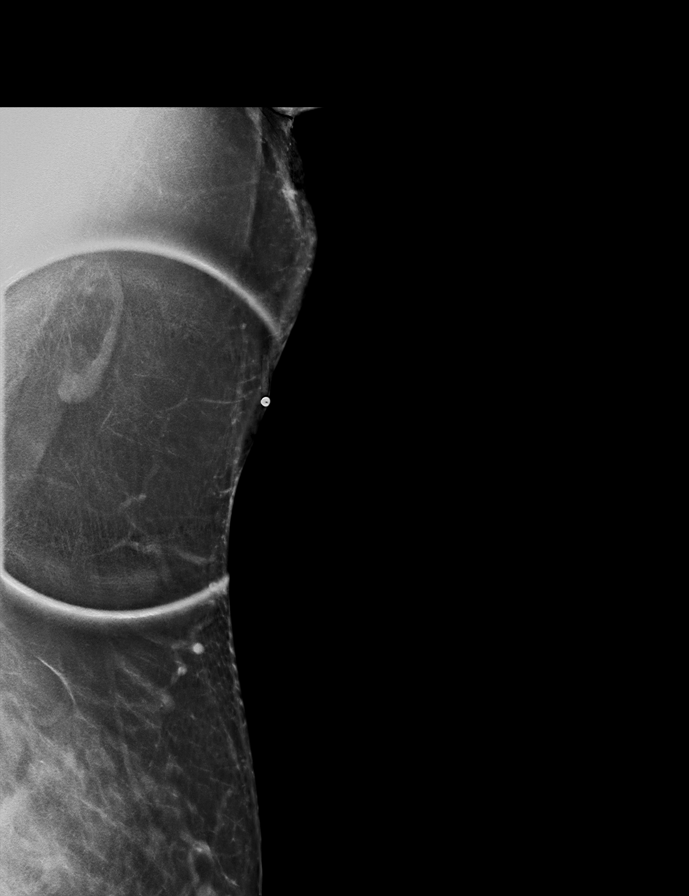

[R CC]
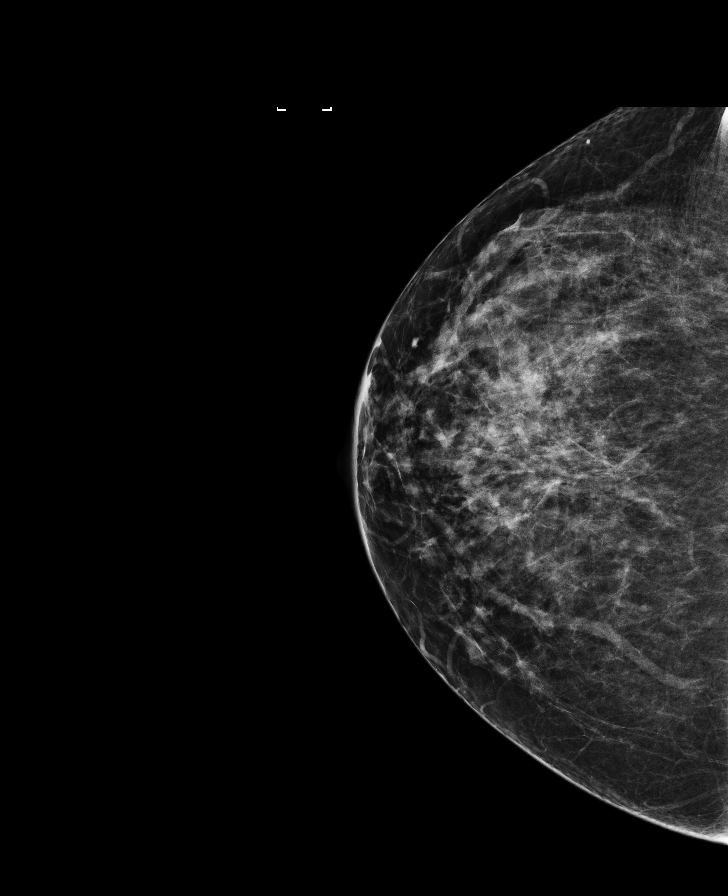

[L TAN]
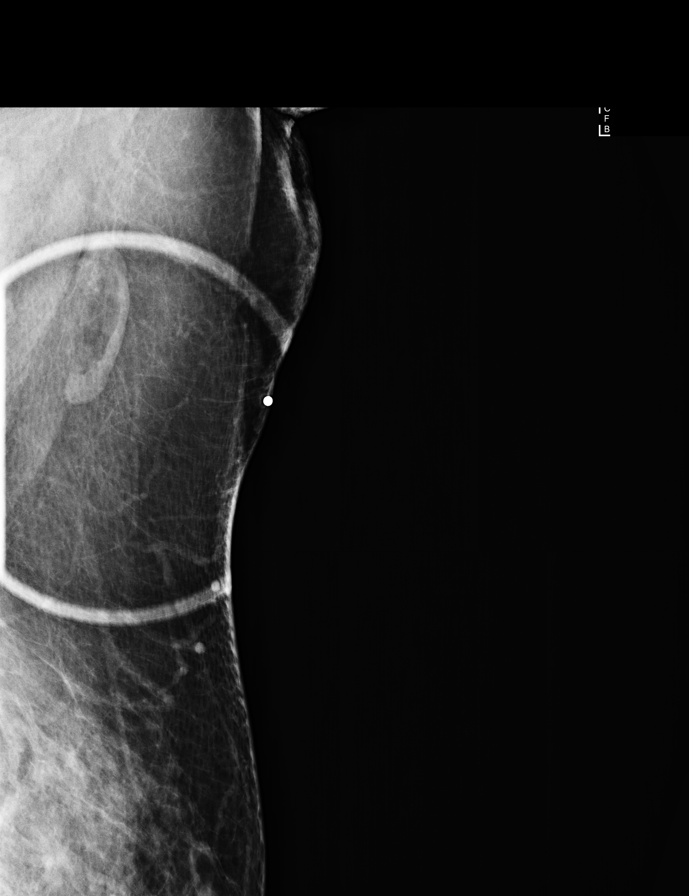

[L MLO]
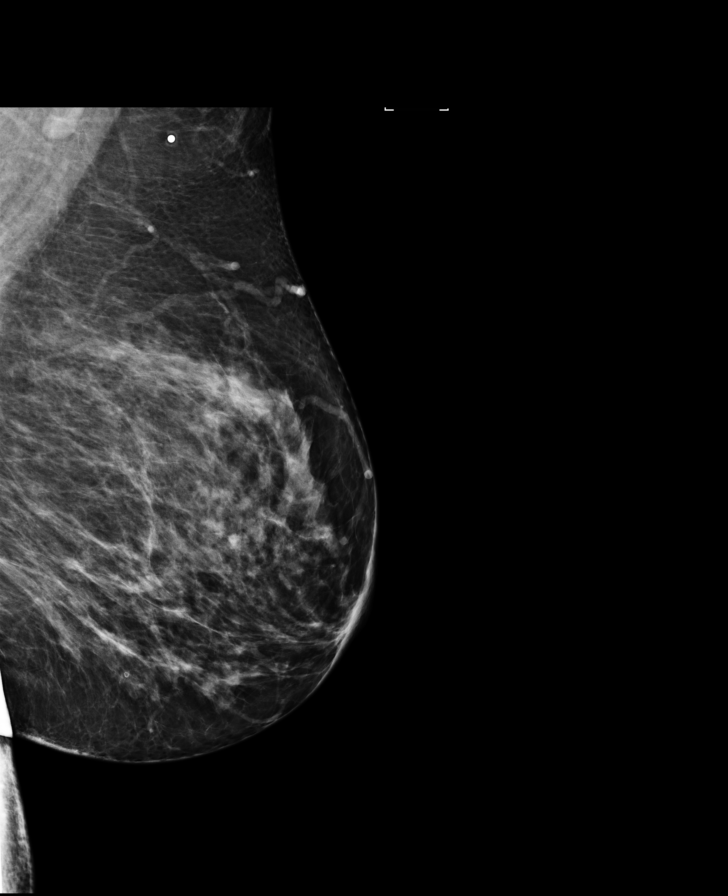

[L CC synth-2D]
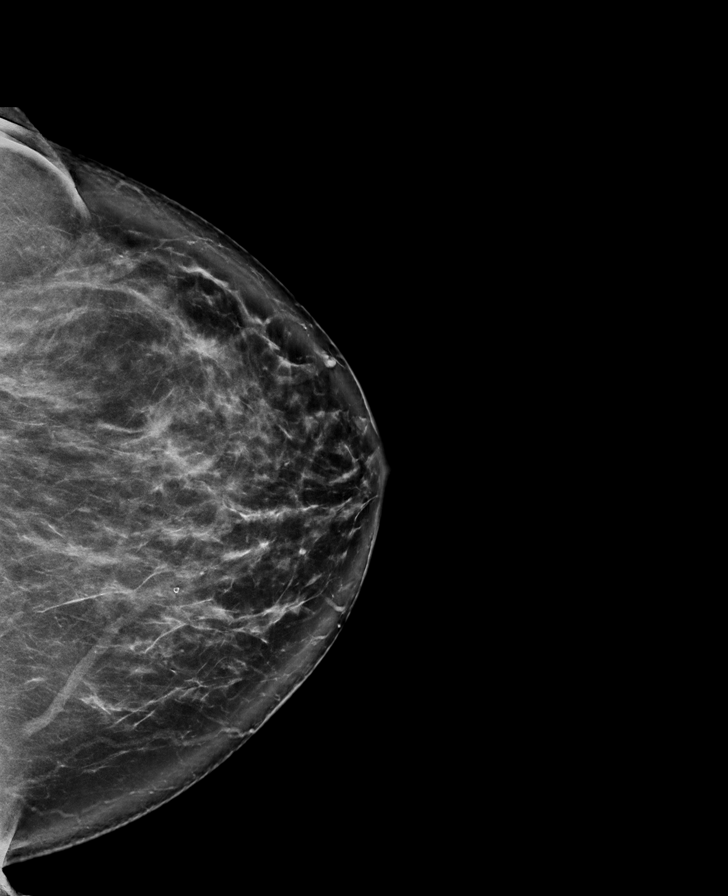

[R MLO synth-2D]
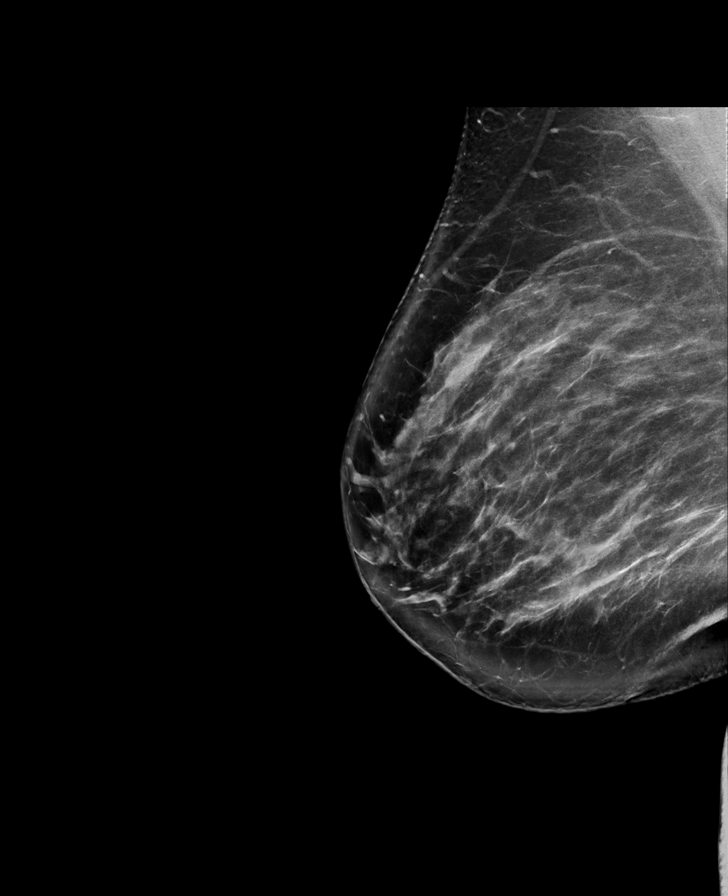

[8 of 35 positions shown; findings below may reference images not displayed]

ACR Breast Density Category c: The breast tissue is heterogeneously
dense, which may obscure small masses.
FINDINGS: No suspicious masses or calcifications are seen in either breast.
Spot compression tangential tomograms were performed over the
palpable area of concern in the left axilla with no suspicious
abnormality seen.

Mammographic images were processed with CAD.

Physical examination at site of palpable concern in the low left
axilla/left axillary tail does not reveal any palpable masses.

Targeted ultrasound of the left axilla/ left axillary tail was
performed. No suspicious masses or abnormalities are seen, only
normal appearing tissue is visualized. There is no axillary
lymphadenopathy.
IMPRESSION: 1.  No mammographic evidence of malignancy in either breast.

2. No mammographic or sonographic correlate for the palpable
abnormality in the low left axilla.

RECOMMENDATION:
1. Recommend further management of the left axilla palpable
abnormality be based on clinical assessment.

2.  Screening mammogram in one year.(Code:IH-S-86B)

I have discussed the findings and recommendations with the patient.
Results were also provided in writing at the conclusion of the
visit. If applicable, a reminder letter will be sent to the patient
regarding the next appointment.

BI-RADS CATEGORY  1: Negative.

## 2018-11-30 DIAGNOSIS — K621 Rectal polyp: Secondary | ICD-10-CM | POA: Diagnosis not present

## 2018-11-30 DIAGNOSIS — Z86010 Personal history of colonic polyps: Secondary | ICD-10-CM | POA: Diagnosis not present

## 2018-11-30 DIAGNOSIS — D125 Benign neoplasm of sigmoid colon: Secondary | ICD-10-CM | POA: Diagnosis not present

## 2019-03-14 ENCOUNTER — Other Ambulatory Visit: Payer: Self-pay | Admitting: Obstetrics & Gynecology

## 2019-03-14 DIAGNOSIS — R928 Other abnormal and inconclusive findings on diagnostic imaging of breast: Secondary | ICD-10-CM

## 2019-03-19 ENCOUNTER — Other Ambulatory Visit: Payer: 59

## 2019-03-21 ENCOUNTER — Ambulatory Visit: Admission: RE | Admit: 2019-03-21 | Payer: 59 | Source: Ambulatory Visit

## 2019-03-21 ENCOUNTER — Other Ambulatory Visit: Payer: Self-pay

## 2019-03-21 ENCOUNTER — Ambulatory Visit
Admission: RE | Admit: 2019-03-21 | Discharge: 2019-03-21 | Disposition: A | Discharge status: Home / Self Care | Payer: 59 | Source: Ambulatory Visit | Attending: Obstetrics & Gynecology | Admitting: Obstetrics & Gynecology

## 2019-03-21 ENCOUNTER — Other Ambulatory Visit: Payer: Self-pay | Admitting: Obstetrics & Gynecology

## 2019-03-21 DIAGNOSIS — R928 Other abnormal and inconclusive findings on diagnostic imaging of breast: Secondary | ICD-10-CM

## 2019-03-27 ENCOUNTER — Other Ambulatory Visit: Payer: Self-pay

## 2019-03-27 ENCOUNTER — Ambulatory Visit
Admission: RE | Admit: 2019-03-27 | Discharge: 2019-03-27 | Disposition: A | Discharge status: Home / Self Care | Payer: 59 | Source: Ambulatory Visit | Attending: Obstetrics & Gynecology | Admitting: Obstetrics & Gynecology

## 2019-03-27 DIAGNOSIS — R928 Other abnormal and inconclusive findings on diagnostic imaging of breast: Secondary | ICD-10-CM

## 2019-03-28 ENCOUNTER — Other Ambulatory Visit: Payer: Self-pay | Admitting: Obstetrics & Gynecology

## 2019-03-28 DIAGNOSIS — R921 Mammographic calcification found on diagnostic imaging of breast: Secondary | ICD-10-CM

## 2019-03-28 DIAGNOSIS — N6489 Other specified disorders of breast: Secondary | ICD-10-CM

## 2019-03-30 ENCOUNTER — Ambulatory Visit
Admission: RE | Admit: 2019-03-30 | Discharge: 2019-03-30 | Disposition: A | Discharge status: Home / Self Care | Payer: 59 | Source: Ambulatory Visit | Attending: Obstetrics & Gynecology | Admitting: Obstetrics & Gynecology

## 2019-03-30 ENCOUNTER — Other Ambulatory Visit: Payer: Self-pay

## 2019-03-30 ENCOUNTER — Other Ambulatory Visit: Payer: Self-pay | Admitting: Obstetrics & Gynecology

## 2019-03-30 DIAGNOSIS — R921 Mammographic calcification found on diagnostic imaging of breast: Secondary | ICD-10-CM

## 2019-03-30 DIAGNOSIS — N6489 Other specified disorders of breast: Secondary | ICD-10-CM

## 2019-04-04 ENCOUNTER — Other Ambulatory Visit: Payer: Self-pay | Admitting: General Surgery

## 2019-04-04 DIAGNOSIS — R928 Other abnormal and inconclusive findings on diagnostic imaging of breast: Secondary | ICD-10-CM

## 2019-04-07 ENCOUNTER — Other Ambulatory Visit: Payer: Self-pay | Admitting: General Surgery

## 2019-04-07 DIAGNOSIS — R928 Other abnormal and inconclusive findings on diagnostic imaging of breast: Secondary | ICD-10-CM

## 2019-05-08 NOTE — Progress Notes (Signed)
[id number] - [place], [place] [address] [place] [address] Phone: [phone] Fax: [phone]      Your procedure is scheduled on August 25th.  Report to [facility] Main Entrance "A" at 5:30 A.M., and check in at the Admitting office.  Call this number if you have problems the morning of surgery:  [phone]  Call [phone] if you have any questions prior to your surgery date Monday-Friday 8am-4pm    Remember:  Do not eat after midnight the night before your surgery  You may drink clear liquids until 4:30 AM the morning of your surgery.   Clear liquids allowed are: Water, Non-Citrus Juices (without pulp), Carbonated Beverages, Clear Tea, Black Coffee Only, and Gatorade    Take these medicines the morning of surgery with A SIP OF WATER   Hydrocodone-Acetaminophen  Lorazepam (Ativan)  Omeprazole (prilosec)    7 days prior to surgery STOP taking any Aspirin (unless otherwise instructed by your surgeon), Aleve, Naproxen, Ibuprofen, Motrin, Advil, Goody's, BC's, all herbal medications, fish oil, and all vitamins.   WHAT DO I DO ABOUT MY DIABETES MEDICATION?   Marland Kitchen Do not take oral diabetes medicines (pills) the morning of surgery. - Metformin  . THE NIGHT BEFORE SURGERY, take 12 units (50%) of Insuline Glargine.      . The day of surgery, do not take other diabetes injectables, including Byetta (exenatide), Bydureon (exenatide ER), Victoza (liraglutide), or Trulicity (dulaglutide).  . If your CBG is greater than 220 mg/dL, you may take  of your sliding scale (correction) dose of insulin.   How to Manage Your Diabetes Before and After Surgery  Why is it important to control my blood sugar before and after surgery? . Improving blood sugar levels before and after surgery helps healing and can limit problems. . A way of improving blood sugar control is eating a healthy diet by: o  Eating less sugar and carbohydrates o  Increasing  activity/exercise o  Talking with your doctor about reaching your blood sugar goals . High blood sugars (greater than 180 mg/dL) can raise your risk of infections and slow your recovery, so you will need to focus on controlling your diabetes during the weeks before surgery. . Make sure that the doctor who takes care of your diabetes knows about your planned surgery including the date and location.  How do I manage my blood sugar before surgery? . Check your blood sugar at least 4 times a day, starting 2 days before surgery, to make sure that the level is not too high or low. o Check your blood sugar the morning of your surgery when you wake up and every 2 hours until you get to the Short Stay unit. . If your blood sugar is less than 70 mg/dL, you will need to treat for low blood sugar: o Do not take insulin. o Treat a low blood sugar (less than 70 mg/dL) with  cup of clear juice (cranberry or apple), 4 glucose tablets, OR glucose gel. o Recheck blood sugar in 15 minutes after treatment (to make sure it is greater than 70 mg/dL). If your blood sugar is not greater than 70 mg/dL on recheck, call [phone] for further instructions. . Report your blood sugar to the short stay nurse when you get to Short Stay.  . If you are admitted to the hospital after surgery: o Your blood sugar will be checked by the staff and you will probably be given insulin after surgery (  instead of oral diabetes medicines) to make sure you have good blood sugar levels. o The goal for blood sugar control after surgery is 80-180 mg/dL.    The Morning of Surgery  Do not wear jewelry, make-up or nail polish.  Do not wear lotions, powders, or perfumes, or deodorant  Do not shave 48 hours prior to surgery.    Do not bring valuables to the hospital.  [facility] is not responsible for any belongings or valuables.  If you are a smoker, DO NOT Smoke 24 hours prior to surgery IF you wear a CPAP at night please bring your  mask, tubing, and machine the morning of surgery   Remember that you must have someone to transport you home after your surgery, and remain with you for 24 hours if you are discharged the same day.   Contacts, glasses, hearing aids, dentures or bridgework may not be worn into surgery.    Leave your suitcase in the car.  After surgery it may be brought to your room.  For patients admitted to the hospital, discharge time will be determined by your treatment team.  Patients discharged the day of surgery will not be allowed to drive home.    Special instructions:   [place]- Preparing For Surgery  Before surgery, you can play an important role. Because skin is not sterile, your skin needs to be as free of germs as possible. You can reduce the number of germs on your skin by washing with CHG (chlorahexidine gluconate) Soap before surgery.  CHG is an antiseptic cleaner which kills germs and bonds with the skin to continue killing germs even after washing.    Oral Hygiene is also important to reduce your risk of infection.  Remember - BRUSH YOUR TEETH THE MORNING OF SURGERY WITH YOUR REGULAR TOOTHPASTE  Please do not use if you have an allergy to CHG or antibacterial soaps. If your skin becomes reddened/irritated stop using the CHG.  Do not shave (including legs and underarms) for at least 48 hours prior to first CHG shower. It is OK to shave your face.  Please follow these instructions carefully.   1. Shower the NIGHT BEFORE SURGERY and the MORNING OF SURGERY with CHG Soap.   2. If you chose to wash your hair, wash your hair first as usual with your normal shampoo.  3. After you shampoo, rinse your hair and body thoroughly to remove the shampoo.  4. Use CHG as you would any other liquid soap. You can apply CHG directly to the skin and wash gently with a scrungie or a clean washcloth.   5. Apply the CHG Soap to your body ONLY FROM THE NECK DOWN.  Do not use on open wounds or open  sores. Avoid contact with your eyes, ears, mouth and genitals (private parts). Wash Face and genitals (private parts)  with your normal soap.   6. Wash thoroughly, paying special attention to the area where your surgery will be performed.  7. Thoroughly rinse your body with warm water from the neck down.  8. DO NOT shower/wash with your normal soap after using and rinsing off the CHG Soap.  9. Pat yourself dry with a CLEAN TOWEL.  10. Wear CLEAN PAJAMAS to bed the night before surgery, wear comfortable clothes the morning of surgery  11. Place CLEAN SHEETS on your bed the night of your first shower and DO NOT SLEEP WITH PETS.   Day of Surgery:  Do not apply any  deodorants/lotions. Please shower the morning of surgery with the CHG soap  Please wear clean clothes to the hospital/surgery center.   Remember to brush your teeth WITH YOUR REGULAR TOOTHPASTE.   Please read over the following fact sheets that you were given.

## 2019-05-09 ENCOUNTER — Encounter (HOSPITAL_COMMUNITY)
Admission: RE | Admit: 2019-05-09 | Discharge: 2019-05-09 | Disposition: A | Discharge status: Home / Self Care | Payer: 59 | Source: Ambulatory Visit | Attending: General Surgery | Admitting: General Surgery

## 2019-05-09 ENCOUNTER — Encounter (HOSPITAL_COMMUNITY): Payer: Self-pay

## 2019-05-09 ENCOUNTER — Other Ambulatory Visit: Payer: Self-pay

## 2019-05-09 DIAGNOSIS — Z01818 Encounter for other preprocedural examination: Secondary | ICD-10-CM | POA: Diagnosis not present

## 2019-05-09 DIAGNOSIS — Z20828 Contact with and (suspected) exposure to other viral communicable diseases: Secondary | ICD-10-CM | POA: Insufficient documentation

## 2019-05-09 HISTORY — DX: Type 2 diabetes mellitus without complications: E11.9

## 2019-05-09 HISTORY — DX: Other specified postprocedural states: R11.2

## 2019-05-09 HISTORY — DX: Other specified postprocedural states: Z98.890

## 2019-05-09 LAB — BASIC METABOLIC PANEL
Anion gap: 14 (ref 5–15)
BUN: 12 mg/dL (ref 6–20)
CO2: 23 mmol/L (ref 22–32)
Calcium: 9.3 mg/dL (ref 8.9–10.3)
Chloride: 97 mmol/L — ABNORMAL LOW (ref 98–111)
Creatinine, Ser: 0.58 mg/dL (ref 0.44–1.00)
GFR calc Af Amer: >60 mL/min (ref >60)
GFR calc non Af Amer: >60 mL/min (ref >60)
Glucose, Bld: 416 mg/dL — ABNORMAL HIGH (ref 70–99)
Potassium: 5.1 mmol/L (ref 3.5–5.1)
Sodium: 134 mmol/L — ABNORMAL LOW (ref 135–145)

## 2019-05-09 LAB — CBC
HCT: 46.8 % — ABNORMAL HIGH (ref 36.0–46.0)
Hemoglobin: 15.3 g/dL — ABNORMAL HIGH (ref 12.0–15.0)
MCH: 28.6 pg (ref 26.0–34.0)
MCHC: 32.7 g/dL (ref 30.0–36.0)
MCV: 87.5 fL (ref 80.0–100.0)
Platelets: 209 10*3/uL (ref 150–400)
RBC: 5.35 MIL/uL — ABNORMAL HIGH (ref 3.87–5.11)
RDW: 13.4 % (ref 11.5–15.5)
WBC: 6.9 10*3/uL (ref 4.0–10.5)
nRBC: 0 % (ref 0.0–0.2)

## 2019-05-09 LAB — HEMOGLOBIN A1C
Hgb A1c MFr Bld: 14.5 % — ABNORMAL HIGH (ref 4.8–5.6)
Mean Plasma Glucose: 369.45 mg/dL

## 2019-05-09 LAB — GLUCOSE, CAPILLARY: Glucose-Capillary: 383 mg/dL — ABNORMAL HIGH (ref 70–99)

## 2019-05-09 NOTE — Progress Notes (Signed)
PCP - Lavone Orn Cardiologist [facility] 10 + yrs. ago  Chest x-ray - na EKG - today Stress Test - 10+ yrs ECHO - ? Cardiac Cath - na  Sleep Study - na CPAP -   Fasting Blood Sugar - 300-400 Checks Blood Sugar ___3-4__ times a day  Blood Thinner Instructions: Aspirin Instructions:na  Anesthesia review: blood sugars/previous cardiac studies  Patient denies shortness of breath, fever, cough and chest pain at PAT appointment   Patient verbalized understanding of instructions that were given to them at the PAT appointment. Patient was also instructed that they will need to review over the PAT instructions again at home before surgery.   Pt. To get radioactive seed on 05/14/19 Pt. To contact Dr. Laurann Montana to discuss high blood sugars prior to surgery.

## 2019-05-09 NOTE — Progress Notes (Signed)
Please let patient know that her blood glucose needs to be 250 or lower on the day of surgery or anesthesia may cancel her case.

## 2019-05-10 NOTE — Anesthesia Preprocedure Evaluation (Addendum)
Anesthesia Evaluation  Patient identified by MRN, date of birth, ID band Patient awake    Reviewed: Allergy & Precautions, NPO status , Patient's Chart, lab work & pertinent test results  History of Anesthesia Complications (+) PONV  Airway Mallampati: I  TM Distance: >3 FB Neck ROM: Full    Dental no notable dental hx.    Pulmonary Current Smoker and Patient abstained from smoking.,    Pulmonary exam normal        Cardiovascular hypertension, Pt. on medications Normal cardiovascular exam     Neuro/Psych negative neurological ROS     GI/Hepatic Neg liver ROS, GERD  Medicated and Controlled,  Endo/Other  diabetes, Poorly Controlled, Type 2, Insulin Dependent  Renal/GU negative Renal ROS     Musculoskeletal negative musculoskeletal ROS (+)   Abdominal   Peds  Hematology negative hematology ROS (+)   Anesthesia Other Findings Breast mass  Reproductive/Obstetrics                           Anesthesia Physical Anesthesia Plan  ASA: II  Anesthesia Plan: General   Post-op Pain Management:    Induction: Intravenous  PONV Risk Score and Plan: 4 or greater and Treatment may vary due to age or medical condition, Ondansetron, Midazolam and Propofol infusion  Airway Management Planned: Oral ETT  Additional Equipment:   Intra-op Plan:   Post-operative Plan: Extubation in OR  Informed Consent: I have reviewed the patients History and Physical, chart, labs and discussed the procedure including the risks, benefits and alternatives for the proposed anesthesia with the patient or authorized representative who has indicated his/her understanding and acceptance.     Dental advisory given  Plan Discussed with: CRNA  Anesthesia Plan Comments: (Uncontrolled IDDMII. A1c 14.5 and CBG 383 on 05/09/19. Pt reports fasting sugars 300-400 for several months. Says she has not seen her PCP due to Covid.  She was advised to cal her PCP ASAP for guidance and told her BG may be prohibitive for surgery. I called Dr. Marlowe Aschoff office to make her aware of poor BG control. She advised to proceed as planned pending BG on DOS. I called pt on 8/20 to follow-up. She spoke with her PCP 8/19 and was instructed to increase her long acting insulin dose. She says her fasting BG this AM was 330. She was also called by the breast center and told that if her BG > 250 on 8/24 the seed will not be placed. She understands to continue monitoring and call her PCP back if no significant improvement in fasting BG.\  *No decadron (poorly controlled DM)* )   Anesthesia Quick Evaluation

## 2019-05-11 ENCOUNTER — Other Ambulatory Visit (HOSPITAL_COMMUNITY)
Admission: RE | Admit: 2019-05-11 | Discharge: 2019-05-11 | Disposition: A | Discharge status: Home / Self Care | Payer: 59 | Source: Ambulatory Visit | Attending: General Surgery | Admitting: General Surgery

## 2019-05-11 DIAGNOSIS — Z01818 Encounter for other preprocedural examination: Secondary | ICD-10-CM | POA: Diagnosis not present

## 2019-05-11 LAB — SARS CORONAVIRUS 2 (TAT 6-24 HRS): SARS Coronavirus 2: NEGATIVE

## 2019-05-14 ENCOUNTER — Other Ambulatory Visit: Payer: Self-pay

## 2019-05-14 ENCOUNTER — Ambulatory Visit
Admission: RE | Admit: 2019-05-14 | Discharge: 2019-05-14 | Disposition: A | Discharge status: Home / Self Care | Payer: 59 | Source: Ambulatory Visit | Attending: General Surgery | Admitting: General Surgery

## 2019-05-14 DIAGNOSIS — R928 Other abnormal and inconclusive findings on diagnostic imaging of breast: Secondary | ICD-10-CM

## 2019-05-14 NOTE — H&P (Signed)
Diamond [name] Location: [facility] Patient #: [id number] DOB: 10-Apr-1966 Divorced / Language: Diamond [name] / Race: White Female   History of Present Illness Diamond Klein MD; 04/23/2019 1:53 PM) The patient is a 53 year old female who presents with a complaint of Breast problems. Pt is a 53 yo F referred by Dr. Nori [name] for abnormal right mammogram. She was found to have indeterminate calcifications in the UOQ of the right breast. Dx mammogram confirmed these. 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 She presents to discuss excision. She has no personal or family history of breast cancer, though both parents had colon polyps. She denies previous issues with her breasts.    dx mammogram 03/21/2019 CLINICAL DATA: The patient was called back for right breast calcifications and possible right breast distortion.  EXAM: DIGITAL DIAGNOSTIC UNILATERAL RIGHT MAMMOGRAM WITH CAD AND TOMO  COMPARISON: Previous exam(s).  ACR Breast Density Category c: The breast tissue is heterogeneously dense, which may obscure small masses.  FINDINGS: The calcifications in the upper outer right breast are primarily round and punctate. There are several groups. The largest group spans 5 mm. Possible distortion in the right breast resolves on additional imaging.  Mammographic images were processed with CAD.  IMPRESSION: Multiple indeterminate groups of calcifications in the lateral right breast. The largest group spans 5 mm and is the most laterally located group. No other suspicious findings identified.  RECOMMENDATION: Recommend stereotactic biopsy of the 5 mm group of calcifications in the upper outer right breast. If the calcifications are benign at biopsy, recommend six-month follow-up of the other surrounding groups. If the biopsy demonstrates malignancy, consider biopsying 1 of the other  groups of calcifications.  I have discussed the findings and recommendations with the patient. Results were also provided in writing at the conclusion of the visit. If applicable, a reminder letter will be sent to the patient regarding the next appointment.  BI-RADS CATEGORY 4: Suspicious.    Physical Exam Diamond Klein MD; 04/23/2019 1:54 PM) General Mental Status-Alert. General Appearance-Consistent with stated age. Hydration-Well hydrated. Voice-Normal.  Head and Neck Head-normocephalic, atraumatic with no lesions or palpable masses. Trachea-midline. Thyroid Gland Characteristics - normal size and consistency.  Eye Eyeball - Bilateral-Extraocular movements intact. Sclera/Conjunctiva - Bilateral-No scleral icterus.  Chest and Lung Exam Chest and lung exam reveals -quiet, even and easy respiratory effort with no use of accessory muscles and on auscultation, normal breath sounds, no adventitious sounds and normal vocal resonance. Inspection Chest Wall - Normal. Back - normal.  Breast Note: no palpable masses. no skin dimpling, no nipple retraction or nipple discharge. no LAD. some faint bruising at biopsy sites on right   Cardiovascular Cardiovascular examination reveals -normal heart sounds, regular rate and rhythm with no murmurs and normal pedal pulses bilaterally.  Abdomen Inspection Inspection of the abdomen reveals - No Hernias. Palpation/Percussion Palpation and Percussion of the abdomen reveal - Soft, Non Tender, No Rebound tenderness, No Rigidity (guarding) and No hepatosplenomegaly. Auscultation Auscultation of the abdomen reveals - Bowel sounds normal.  Neurologic Neurologic evaluation reveals -alert and oriented x 3 with no impairment of recent or remote memory. Mental Status-Normal.  Musculoskeletal Global Assessment -Note: no gross deformities.  Normal Exam - Left-Upper Extremity Strength Normal and Lower Extremity  Strength Normal. Normal Exam - Right-Upper Extremity Strength Normal and Lower Extremity Strength Normal.  Lymphatic Head & Neck  General Head & Neck Lymphatics: Bilateral -  Description - Normal. Axillary  General Axillary Region: Bilateral - Description - Normal. Tenderness - Non Tender. Femoral & Inguinal  Generalized Femoral & Inguinal Lymphatics: Bilateral - Description - No Generalized lymphadenopathy.    Assessment & Plan Diamond Klein MD; 04/23/2019 1:54 PM)  ABNORMAL MAMMOGRAM OF RIGHT BREAST (R92.8) Impression: Patient will need 2 of the 3 areas of biopsy excised. We will plan to do seed localized excisional biopsy x2. Patient was a bit distressed to hear about seed placement. I have written her a prescription for several oxycodone for the procedure. I discussed with the patient that she will need a driver for the procedure if she takes pain medication. I also discussed that she will need to make arrangements to sign her consent form ahead of time if she takes pain medication.  I reviewed surgery with the patient. She would like to have this done after her vacation in August 15.  The surgical procedure was described to the patient. I discussed the incision type and location and that we would need radiology involved on with a wire or seed marker and/or sentinel node.  The risks and benefits of the procedure were described to the patient and she wishes to proceed.  We discussed the risks bleeding, infection, damage to other structures, need for further procedures/surgeries. We discussed the risk of seroma. The patient was advised if the area in the breast in cancer, we may need to go back to surgery for additional tissue to obtain negative margins or for a lymph node biopsy. The patient was advised that these are the most common complications, but that others can occur as well. They were advised against taking aspirin or other anti-inflammatory agents/blood thinners the week before  surgery.  Current Plans Started oxyCODONE HCl 5 MG Oral Tablet, 1 (one) Tablet every six hours, as needed for pain, #8, 04/11/2019, No Refill.   Signed by Diamond Klein, MD (04/23/2019 1:55 PM)

## 2019-05-15 ENCOUNTER — Ambulatory Visit (HOSPITAL_COMMUNITY): Payer: 59 | Admitting: Certified Registered Nurse Anesthetist

## 2019-05-15 ENCOUNTER — Ambulatory Visit (HOSPITAL_COMMUNITY): Payer: 59 | Admitting: Physician Assistant

## 2019-05-15 ENCOUNTER — Encounter (HOSPITAL_COMMUNITY)
Admission: RE | Disposition: A | Discharge status: Home / Self Care | Payer: Self-pay | Source: Ambulatory Visit | Attending: General Surgery

## 2019-05-15 ENCOUNTER — Ambulatory Visit
Admission: RE | Admit: 2019-05-15 | Discharge: 2019-05-15 | Disposition: A | Discharge status: Home / Self Care | Payer: 59 | Source: Ambulatory Visit | Attending: General Surgery | Admitting: General Surgery

## 2019-05-15 ENCOUNTER — Encounter (HOSPITAL_COMMUNITY): Payer: Self-pay | Admitting: Surgery

## 2019-05-15 ENCOUNTER — Ambulatory Visit (HOSPITAL_COMMUNITY)
Admission: RE | Admit: 2019-05-15 | Discharge: 2019-05-15 | Disposition: A | Discharge status: Home / Self Care | Payer: 59 | Source: Ambulatory Visit | Attending: General Surgery | Admitting: General Surgery

## 2019-05-15 DIAGNOSIS — I10 Essential (primary) hypertension: Secondary | ICD-10-CM | POA: Insufficient documentation

## 2019-05-15 DIAGNOSIS — Z791 Long term (current) use of non-steroidal anti-inflammatories (NSAID): Secondary | ICD-10-CM | POA: Diagnosis not present

## 2019-05-15 DIAGNOSIS — F17200 Nicotine dependence, unspecified, uncomplicated: Secondary | ICD-10-CM | POA: Diagnosis not present

## 2019-05-15 DIAGNOSIS — N6489 Other specified disorders of breast: Secondary | ICD-10-CM | POA: Insufficient documentation

## 2019-05-15 DIAGNOSIS — Z79899 Other long term (current) drug therapy: Secondary | ICD-10-CM | POA: Insufficient documentation

## 2019-05-15 DIAGNOSIS — K219 Gastro-esophageal reflux disease without esophagitis: Secondary | ICD-10-CM | POA: Diagnosis not present

## 2019-05-15 DIAGNOSIS — E1165 Type 2 diabetes mellitus with hyperglycemia: Secondary | ICD-10-CM | POA: Diagnosis not present

## 2019-05-15 DIAGNOSIS — R928 Other abnormal and inconclusive findings on diagnostic imaging of breast: Secondary | ICD-10-CM

## 2019-05-15 HISTORY — PX: RADIOACTIVE SEED GUIDED EXCISIONAL BREAST BIOPSY: SHX6490

## 2019-05-15 LAB — GLUCOSE, CAPILLARY
Glucose-Capillary: 224 mg/dL — ABNORMAL HIGH (ref 70–99)
Glucose-Capillary: 243 mg/dL — ABNORMAL HIGH (ref 70–99)
Glucose-Capillary: 230 mg/dL — ABNORMAL HIGH (ref 70–99)

## 2019-05-15 SURGERY — RADIOACTIVE SEED GUIDED BREAST BIOPSY
Anesthesia: General | Site: Breast | Laterality: Right

## 2019-05-15 MED ORDER — LACTATED RINGERS IV SOLN
INTRAVENOUS | Status: DC | PRN
  Administered 2019-05-15 (×2): via INTRAVENOUS

## 2019-05-15 MED ORDER — FENTANYL CITRATE (PF) 100 MCG/2ML IJ SOLN
INTRAMUSCULAR | Status: DC | PRN
  Administered 2019-05-15 (×3): 50 ug via INTRAVENOUS

## 2019-05-15 MED ORDER — ACETAMINOPHEN 500 MG PO TABS
ORAL_TABLET | ORAL | Status: AC
  Administered 2019-05-15: 1000 mg via ORAL
  Filled 2019-05-15: qty 2

## 2019-05-15 MED ORDER — DIPHENHYDRAMINE HCL 50 MG/ML IJ SOLN
INTRAMUSCULAR | Status: DC | PRN
  Administered 2019-05-15: 12.5 mg via INTRAVENOUS

## 2019-05-15 MED ORDER — LIDOCAINE 2% (20 MG/ML) 5 ML SYRINGE
INTRAMUSCULAR | Status: DC | PRN
  Administered 2019-05-15: 80 mg via INTRAVENOUS

## 2019-05-15 MED ORDER — INSULIN ASPART 100 UNIT/ML ~~LOC~~ SOLN
SUBCUTANEOUS | Status: AC
  Filled 2019-05-15: qty 1

## 2019-05-15 MED ORDER — PHENYLEPHRINE 40 MCG/ML (10ML) SYRINGE FOR IV PUSH (FOR BLOOD PRESSURE SUPPORT)
PREFILLED_SYRINGE | INTRAVENOUS | Status: AC
  Filled 2019-05-15: qty 10

## 2019-05-15 MED ORDER — CEFAZOLIN SODIUM-DEXTROSE 2-4 GM/100ML-% IV SOLN
INTRAVENOUS | Status: AC
  Filled 2019-05-15: qty 100

## 2019-05-15 MED ORDER — FENTANYL CITRATE (PF) 100 MCG/2ML IJ SOLN: 25.0000 ug | INTRAMUSCULAR | Status: DC | PRN

## 2019-05-15 MED ORDER — LIDOCAINE 2% (20 MG/ML) 5 ML SYRINGE
INTRAMUSCULAR | Status: AC
  Filled 2019-05-15: qty 5

## 2019-05-15 MED ORDER — MIDAZOLAM HCL 2 MG/2ML IJ SOLN
INTRAMUSCULAR | Status: AC
  Filled 2019-05-15: qty 2

## 2019-05-15 MED ORDER — PROMETHAZINE HCL 25 MG/ML IJ SOLN: 6.2500 mg | INTRAMUSCULAR | Status: DC | PRN

## 2019-05-15 MED ORDER — LIDOCAINE HCL (PF) 1 % IJ SOLN
INTRAMUSCULAR | Status: AC
  Filled 2019-05-15: qty 30

## 2019-05-15 MED ORDER — ACETAMINOPHEN 500 MG PO TABS: 1000.0000 mg | ORAL_TABLET | Freq: Once | ORAL | Status: DC

## 2019-05-15 MED ORDER — ONDANSETRON HCL 4 MG/2ML IJ SOLN
INTRAMUSCULAR | Status: AC
  Filled 2019-05-15: qty 2

## 2019-05-15 MED ORDER — MIDAZOLAM HCL 2 MG/2ML IJ SOLN
INTRAMUSCULAR | Status: DC | PRN
  Administered 2019-05-15: 2 mg via INTRAVENOUS

## 2019-05-15 MED ORDER — BUPIVACAINE-EPINEPHRINE (PF) 0.25% -1:200000 IJ SOLN
INTRAMUSCULAR | Status: AC
  Filled 2019-05-15: qty 30

## 2019-05-15 MED ORDER — DEXAMETHASONE SODIUM PHOSPHATE 10 MG/ML IJ SOLN
INTRAMUSCULAR | Status: AC
  Filled 2019-05-15: qty 1

## 2019-05-15 MED ORDER — CEFAZOLIN SODIUM-DEXTROSE 2-4 GM/100ML-% IV SOLN
2.0000 g | INTRAVENOUS | Status: AC
  Administered 2019-05-15: 08:00:00 2 g via INTRAVENOUS

## 2019-05-15 MED ORDER — CELECOXIB 200 MG PO CAPS
ORAL_CAPSULE | ORAL | Status: AC
  Administered 2019-05-15: 200 mg via ORAL
  Filled 2019-05-15: qty 1

## 2019-05-15 MED ORDER — ONDANSETRON HCL 4 MG/2ML IJ SOLN
INTRAMUSCULAR | Status: DC | PRN
  Administered 2019-05-15: 4 mg via INTRAVENOUS

## 2019-05-15 MED ORDER — 0.9 % SODIUM CHLORIDE (POUR BTL) OPTIME
TOPICAL | Status: DC | PRN
  Administered 2019-05-15: 1000 mL

## 2019-05-15 MED ORDER — METOCLOPRAMIDE HCL 5 MG/ML IJ SOLN
INTRAMUSCULAR | Status: AC
  Filled 2019-05-15: qty 2

## 2019-05-15 MED ORDER — PROPOFOL 10 MG/ML IV BOLUS
INTRAVENOUS | Status: DC | PRN
  Administered 2019-05-15: 160 mg via INTRAVENOUS
  Administered 2019-05-15: 40 mg via INTRAVENOUS

## 2019-05-15 MED ORDER — INSULIN ASPART 100 UNIT/ML ~~LOC~~ SOLN
5.0000 [IU] | Freq: Once | SUBCUTANEOUS | Status: AC
  Administered 2019-05-15: 5 [IU] via SUBCUTANEOUS

## 2019-05-15 MED ORDER — LIDOCAINE HCL 1 % IJ SOLN
INTRAMUSCULAR | Status: DC | PRN
  Administered 2019-05-15: 50 mL

## 2019-05-15 MED ORDER — CHLORHEXIDINE GLUCONATE CLOTH 2 % EX PADS: 6.0000 | MEDICATED_PAD | Freq: Once | CUTANEOUS | Status: DC

## 2019-05-15 MED ORDER — PHENYLEPHRINE 40 MCG/ML (10ML) SYRINGE FOR IV PUSH (FOR BLOOD PRESSURE SUPPORT)
PREFILLED_SYRINGE | INTRAVENOUS | Status: DC | PRN
  Administered 2019-05-15: 80 ug via INTRAVENOUS
  Administered 2019-05-15: 120 ug via INTRAVENOUS

## 2019-05-15 MED ORDER — FENTANYL CITRATE (PF) 250 MCG/5ML IJ SOLN
INTRAMUSCULAR | Status: AC
  Filled 2019-05-15: qty 5

## 2019-05-15 MED ORDER — STERILE WATER FOR IRRIGATION IR SOLN
Status: DC | PRN
  Administered 2019-05-15: 1000 mL

## 2019-05-15 MED ORDER — DIPHENHYDRAMINE HCL 50 MG/ML IJ SOLN
INTRAMUSCULAR | Status: AC
  Filled 2019-05-15: qty 1

## 2019-05-15 MED ORDER — SUGAMMADEX SODIUM 200 MG/2ML IV SOLN
INTRAVENOUS | Status: DC | PRN
  Administered 2019-05-15: 100 mg via INTRAVENOUS

## 2019-05-15 MED ORDER — ROCURONIUM BROMIDE 100 MG/10ML IV SOLN
INTRAVENOUS | Status: DC | PRN
  Administered 2019-05-15: 10 mg via INTRAVENOUS

## 2019-05-15 MED ORDER — CELECOXIB 200 MG PO CAPS
200.0000 mg | ORAL_CAPSULE | ORAL | Status: AC
  Administered 2019-05-15: 06:00:00 200 mg via ORAL

## 2019-05-15 MED ORDER — OXYCODONE HCL 5 MG PO TABS: 5.0000 mg | ORAL_TABLET | Freq: Once | ORAL | Status: DC | PRN

## 2019-05-15 MED ORDER — SUCCINYLCHOLINE CHLORIDE 200 MG/10ML IV SOSY
PREFILLED_SYRINGE | INTRAVENOUS | Status: DC | PRN
  Administered 2019-05-15: 100 mg via INTRAVENOUS

## 2019-05-15 MED ORDER — ACETAMINOPHEN 500 MG PO TABS
1000.0000 mg | ORAL_TABLET | ORAL | Status: AC
  Administered 2019-05-15: 06:00:00 1000 mg via ORAL

## 2019-05-15 MED ORDER — PROPOFOL 10 MG/ML IV BOLUS
INTRAVENOUS | Status: AC
  Filled 2019-05-15: qty 40

## 2019-05-15 MED ORDER — OXYCODONE HCL 5 MG/5ML PO SOLN: 5.0000 mg | Freq: Once | ORAL | Status: DC | PRN

## 2019-05-15 SURGICAL SUPPLY — 42 items
APL PRP STRL LF DISP 70% ISPRP (MISCELLANEOUS) ×1
APPLIER CLIP 9.375 MED OPEN (MISCELLANEOUS)
APR CLP MED 9.3 20 MLT OPN (MISCELLANEOUS)
BINDER BREAST LRG (GAUZE/BANDAGES/DRESSINGS) IMPLANT
BINDER BREAST XLRG (GAUZE/BANDAGES/DRESSINGS) ×2 IMPLANT
BLADE SURG 10 STRL SS (BLADE) ×2 IMPLANT
CANISTER SUCT 3000ML PPV (MISCELLANEOUS) ×2 IMPLANT
CHLORAPREP W/TINT 26 (MISCELLANEOUS) ×2 IMPLANT
CLIP APPLIE 9.375 MED OPEN (MISCELLANEOUS) IMPLANT
CLIP VESOCCLUDE LG 6/CT (CLIP) IMPLANT
COVER PROBE W GEL 5X96 (DRAPES) ×2 IMPLANT
COVER SURGICAL LIGHT HANDLE (MISCELLANEOUS) ×2 IMPLANT
COVER WAND RF STERILE (DRAPES) ×2 IMPLANT
DERMABOND ADVANCED (GAUZE/BANDAGES/DRESSINGS) ×1
DERMABOND ADVANCED .7 DNX12 (GAUZE/BANDAGES/DRESSINGS) ×1 IMPLANT
DEVICE DUBIN SPECIMEN MAMMOGRA (MISCELLANEOUS) ×2 IMPLANT
DRAPE CHEST BREAST 15X10 FENES (DRAPES) ×2 IMPLANT
DRSG PAD ABDOMINAL 8X10 ST (GAUZE/BANDAGES/DRESSINGS) ×2 IMPLANT
ELECT CAUTERY BLADE 6.4 (BLADE) ×2 IMPLANT
ELECT REM PT RETURN 9FT ADLT (ELECTROSURGICAL) ×2
ELECTRODE REM PT RTRN 9FT ADLT (ELECTROSURGICAL) ×1 IMPLANT
GLOVE BIO SURGEON STRL SZ 6 (GLOVE) ×2 IMPLANT
GLOVE INDICATOR 6.5 STRL GRN (GLOVE) ×2 IMPLANT
GOWN STRL REUS W/ TWL LRG LVL3 (GOWN DISPOSABLE) ×1 IMPLANT
GOWN STRL REUS W/TWL 2XL LVL3 (GOWN DISPOSABLE) ×2 IMPLANT
GOWN STRL REUS W/TWL LRG LVL3 (GOWN DISPOSABLE) ×1
ILLUMINATOR WAVEGUIDE N/F (MISCELLANEOUS) IMPLANT
KIT BASIN OR (CUSTOM PROCEDURE TRAY) ×2 IMPLANT
KIT MARKER MARGIN INK (KITS) ×2 IMPLANT
LIGHT WAVEGUIDE WIDE FLAT (MISCELLANEOUS) IMPLANT
NEEDLE HYPO 25GX1X1/2 BEV (NEEDLE) ×2 IMPLANT
NS IRRIG 1000ML POUR BTL (IV SOLUTION) ×2 IMPLANT
PACK GENERAL/GYN (CUSTOM PROCEDURE TRAY) ×2 IMPLANT
STRIP CLOSURE SKIN 1/2X4 (GAUZE/BANDAGES/DRESSINGS) ×2 IMPLANT
SUT MNCRL AB 4-0 PS2 18 (SUTURE) ×2 IMPLANT
SUT VIC AB 2-0 SH 27 (SUTURE) ×1
SUT VIC AB 2-0 SH 27XBRD (SUTURE) ×1 IMPLANT
SUT VIC AB 3-0 SH 27 (SUTURE) ×2
SUT VIC AB 3-0 SH 27X BRD (SUTURE) ×1 IMPLANT
SYR CONTROL 10ML LL (SYRINGE) ×2 IMPLANT
TOWEL GREEN STERILE (TOWEL DISPOSABLE) ×2 IMPLANT
TOWEL GREEN STERILE FF (TOWEL DISPOSABLE) ×2 IMPLANT

## 2019-05-15 NOTE — Interval H&P Note (Signed)
History and Physical Interval Note:  05/15/2019 7:30 AM  [place]  has presented today for surgery, with the diagnosis of right abnormal mammogram.  The various methods of treatment have been discussed with the patient and family. After consideration of risks, benefits and other options for treatment, the patient has consented to  Procedure(s): RADIOACTIVE SEED GUIDED EXCISIONAL RIGHT BREAST BIOPSY X2 (Right) as a surgical intervention.  The patient's history has been reviewed, patient examined, no change in status, stable for surgery.  I have reviewed the patient's chart and labs.  Questions were answered to the patient's satisfaction.     [name]

## 2019-05-15 NOTE — Transfer of Care (Signed)
Immediate Anesthesia Transfer of Care Note  Patient: Diamond [name]  Procedure(s) Performed: RADIOACTIVE SEED GUIDED EXCISIONAL RIGHT BREAST BIOPSY (Right Breast)  Patient Location: PACU  Anesthesia Type:General  Level of Consciousness: awake, alert  and oriented  Airway & Oxygen Therapy: Patient Spontanous Breathing and Patient connected to face mask oxygen  Post-op Assessment: Report given to RN and Post -op Vital signs reviewed and stable  Post vital signs: Reviewed and stable  Last Vitals:  Vitals Value Taken Time  BP 160/67 05/15/19 0859  Temp    Pulse 77 05/15/19 0859  Resp 14 05/15/19 0859  SpO2 98 % 05/15/19 0859  Vitals shown include unvalidated device data.  Last Pain:  Vitals:   05/15/19 0606  TempSrc:   PainSc: 0-No pain      Patients Stated Pain Goal: 2 ([id number])  Complications: No apparent anesthesia complications

## 2019-05-15 NOTE — Anesthesia Procedure Notes (Signed)
Procedure Name: Intubation Date/Time: 05/15/2019 7:48 AM Performed by: Genelle Bal, CRNA Pre-anesthesia Checklist: Patient identified, Emergency Drugs available, Suction available and Patient being monitored Patient Re-evaluated:Patient Re-evaluated prior to induction Oxygen Delivery Method: Circle system utilized Preoxygenation: Pre-oxygenation with 100% oxygen Induction Type: IV induction Ventilation: Mask ventilation without difficulty Laryngoscope Size: Miller and 2 Grade View: Grade I Tube type: Oral Tube size: 7.0 mm Number of attempts: 1 Airway Equipment and Method: Stylet and Oral airway Placement Confirmation: ETT inserted through vocal cords under direct vision,  positive ETCO2 and breath sounds checked- equal and bilateral Secured at: 21 cm Tube secured with: Tape Dental Injury: Teeth and Oropharynx as per pre-operative assessment

## 2019-05-15 NOTE — Op Note (Signed)
Right Breast Radioactive seed localized lumpectomy  Indications: This patient presents with history of right abnormal mammogram  Pre-operative Diagnosis: abnormal right mammogram with discordant core needle biopsy  Post-operative Diagnosis: Same  Surgeon: [name]   Anesthesia: General endotracheal anesthesia  ASA Class: 2  Procedure Details  The patient was seen in the Holding Room. The risks, benefits, complications, treatment options, and expected outcomes were discussed with the patient. The possibilities of bleeding, infection, the need for additional procedures, failure to diagnose a condition, and creating a complication requiring transfusion or operation were discussed with the patient. The patient concurred with the proposed plan, giving informed consent.  The site of surgery properly noted/marked. The patient was taken to Operating Room # 2, identified, and the procedure verified as Right Breast Seed Localized Lumpectomy. A Time Out was held and the above information confirmed.  The right breast and chest were prepped and draped in standard fashion. The lumpectomy was performed by creating a lateral circumareolar incision near the previously placed radioactive seed.  Dissection was carried down to around the point of maximum signal intensity. The cautery was used to perform the dissection.  Hemostasis was achieved with cautery.   The specimen was inked with the margin marker paint kit.    Specimen radiography confirmed inclusion of the mammographic lesion, the clip, and the seed.  The background signal in the breast was zero.  The wound was irrigated and closed with 3-0 vicryl in layers and 4-0 monocryl subcuticular suture.      Sterile dressings were applied. At the end of the operation, all sponge, instrument, and needle counts were correct.  Findings: grossly clear surgical margins and no adenopathy  Estimated Blood Loss:  min         Specimens: right breast lumpectomy with  seed.         Complications:  None; patient tolerated the procedure well.         Disposition: PACU - hemodynamically stable.         Condition: stable

## 2019-05-15 NOTE — Anesthesia Postprocedure Evaluation (Signed)
Anesthesia Post Note  Patient: Diamond [name]  Procedure(s) Performed: RADIOACTIVE SEED GUIDED EXCISIONAL RIGHT BREAST BIOPSY (Right Breast)     Patient location during evaluation: PACU Anesthesia Type: General Level of consciousness: awake and alert and oriented Pain management: pain level controlled Vital Signs Assessment: post-procedure vital signs reviewed and stable Respiratory status: spontaneous breathing, nonlabored ventilation and respiratory function stable Cardiovascular status: blood pressure returned to baseline Postop Assessment: no apparent nausea or vomiting Anesthetic complications: no    Last Vitals:  Vitals:   05/15/19 1029 05/15/19 1030  BP: (!) 118/52   Pulse: 73 73  Resp: 18 20  Temp:  (!) 36.2 C  SpO2: 98% 99%    Last Pain:  Vitals:   05/15/19 0930  TempSrc:   PainSc: Viola E Shriley Joffe

## 2019-05-16 ENCOUNTER — Encounter (HOSPITAL_COMMUNITY): Payer: Self-pay | Admitting: General Surgery

## 2019-05-16 NOTE — Progress Notes (Signed)
Please let patient know no evidence of cancer on pathology!

## 2020-10-03 ENCOUNTER — Other Ambulatory Visit: Payer: Self-pay | Admitting: Internal Medicine

## 2020-10-03 DIAGNOSIS — Z1231 Encounter for screening mammogram for malignant neoplasm of breast: Secondary | ICD-10-CM

## 2020-12-26 ENCOUNTER — Encounter: Payer: Self-pay | Admitting: Endocrinology

## 2020-12-26 ENCOUNTER — Ambulatory Visit
Admission: RE | Admit: 2020-12-26 | Discharge: 2020-12-26 | Disposition: A | Discharge status: Home / Self Care | Payer: 59 | Source: Ambulatory Visit | Attending: Internal Medicine | Admitting: Internal Medicine

## 2020-12-26 ENCOUNTER — Other Ambulatory Visit: Payer: Self-pay

## 2020-12-26 ENCOUNTER — Ambulatory Visit: Payer: 59 | Admitting: Endocrinology

## 2020-12-26 VITALS — BP 130/80 | HR 93 | Ht 63.5 in | Wt 173.8 lb

## 2020-12-26 DIAGNOSIS — E139 Other specified diabetes mellitus without complications: Secondary | ICD-10-CM

## 2020-12-26 DIAGNOSIS — E119 Type 2 diabetes mellitus without complications: Secondary | ICD-10-CM

## 2020-12-26 DIAGNOSIS — E1143 Type 2 diabetes mellitus with diabetic autonomic (poly)neuropathy: Secondary | ICD-10-CM | POA: Diagnosis not present

## 2020-12-26 DIAGNOSIS — Z1231 Encounter for screening mammogram for malignant neoplasm of breast: Secondary | ICD-10-CM

## 2020-12-26 DIAGNOSIS — Z794 Long term (current) use of insulin: Secondary | ICD-10-CM

## 2020-12-26 LAB — POCT GLYCOSYLATED HEMOGLOBIN (HGB A1C): Hemoglobin A1C: 13.3 % — AB (ref 4.0–5.6)

## 2020-12-26 MED ORDER — LANTUS SOLOSTAR 100 UNIT/ML ~~LOC~~ SOPN: 40.0000 [IU] | PEN_INJECTOR | SUBCUTANEOUS | 3 refills | Status: DC

## 2020-12-26 NOTE — Patient Instructions (Addendum)
good diet and exercise significantly improve the control of your diabetes.  please let me know if you wish to be referred to a dietician.  high blood sugar is very risky to your health.  you should see an eye doctor and dentist every year.  It is very important to get all recommended vaccinations.  Controlling your blood pressure and cholesterol drastically reduces the damage diabetes does to your body.  Those who smoke should quit.  Please discuss these with your doctor.  check your blood sugar twice a day.  vary the time of day when you check, between before the 3 meals, and at bedtime.  also check if you have symptoms of your blood sugar being too high or too low.  please keep a record of the readings and bring it to your next appointment here (or you can bring the meter itself).  You can write it on any piece of paper.  please call us sooner if your blood sugar goes below 70, or if most of your readings are over 200.   We will need to take this complex situation in stages. For now, please stop taking the metformin and glimepiride, and:  I have sent a prescription to your pharmacy, to change basaglar to Lantus, 40 units each morning.   Please call or message [place] next week, to tell us how the blood sugar is doing Please come back for a follow-up appointment in 3 months.

## 2020-12-26 NOTE — Progress Notes (Signed)
Subjective:    Patient ID: Diamond [name], female    DOB: 1965/10/18, 55 y.o.   MRN: [id number]  HPI pt is referred by Dr Laurann Montana, for diabetes.  Pt states DM was dx'ed in  4967; it is complicated by PN and GP; she has been on insulin since 2020, but she takes inconsistently; pt says her diet and exercise are fair; she has never had GDM, pancreatic surgery, severe hypoglycemia or DKA.  She had pancreatitis in 2018, due to hypertriglyceridemia. She has frequent urination.  She also takes metformin, basaglar, and glimepiride.  However, she takes these inconsistently.   Past Medical History:  Diagnosis Date  . Breast mass    Axillary lump left side  . Diabetes mellitus without complication ([place])   . Endometriosis    history of.  Marland Kitchen GERD (gastroesophageal reflux disease)   . Headache    frequent , now less with Prednisone use of 4 weeks.  Marland Kitchen History of colon polyps   . Hyperlipidemia   . Hypertension   . Insomnia   . Pancreatitis 2017  . PONV (postoperative nausea and vomiting)   . Restless leg syndrome     Past Surgical History:  Procedure Laterality Date  . ABDOMINAL HYSTERECTOMY     "fibroids, endometriosis"/left oophorectomy  . ARTERY BIOPSY Right 04/07/2016   Procedure: BIOPSY TEMPORAL ARTERY RIGHT;  Surgeon: [name], MD;  [facility]: [facility];  Service: General;  Laterality: Right;  . bladder tack    . COLONOSCOPY WITH PROPOFOL N/A 06/04/2014   Procedure: COLONOSCOPY WITH PROPOFOL;  Surgeon: Garlan Fair, MD;  [facility];  Service: Endoscopy;  Laterality: N/A;, colon polyps removed.  Marland Kitchen DIAGNOSTIC LAPAROSCOPY     - endometriosis, ovarian cyst, right oophorectomy  . RADIOACTIVE SEED GUIDED EXCISIONAL BREAST BIOPSY Right 05/15/2019   Procedure: RADIOACTIVE SEED GUIDED EXCISIONAL RIGHT BREAST BIOPSY;  Surgeon: [name], MD;  Location: [facility];  Service: General;  Laterality: Right;  . RECTOCELE REPAIR    . TMJ ARTHROPLASTY     retained hardware  .  TONSILLECTOMY      Social History   Socioeconomic History  . Marital status: Divorced    Spouse name: Not on file  . Number of children: Not on file  . Years of education: Not on file  . Highest education level: Not on file  Occupational History  . Not on file  Tobacco Use  . Smoking status: Heavy Tobacco Smoker    Packs/day: 0.50    Years: 30.00    Pack years: 15.00    Types: Cigarettes    Last attempt to quit: 03/23/2013    Years since quitting: 7.7  . Smokeless tobacco: Never Used  . Tobacco comment: Quit  7'14, 04-06-16" occ. has a rare ciagarette with stress situations"  Substance and Sexual Activity  . Alcohol use: Yes    Comment: occ. social,rare  . Drug use: No  . Sexual activity: Yes  Other Topics Concern  . Not on file  Social History Narrative  . Not on file   Social Determinants of Health   Financial Resource Strain: Not on file  Food Insecurity: Not on file  Transportation Needs: Not on file  Physical Activity: Not on file  Stress: Not on file  Social Connections: Not on file  Intimate Partner Violence: Not on file    Current Outpatient Medications on File Prior to Visit  Medication Sig Dispense Refill  . amitriptyline (ELAVIL) 25 MG tablet Take 25 mg  by mouth at bedtime.    Marland Kitchen estradiol (VIVELLE-DOT) 0.1 MG/24HR patch Place 1 patch onto the skin 2 (two) times a week.    . fenofibrate 160 MG tablet Take 1 tablet (160 mg total) by mouth daily. 30 tablet 0  . hydrochlorothiazide (HYDRODIURIL) 25 MG tablet Take 25 mg by mouth daily.    Marland Kitchen HYDROcodone-acetaminophen (NORCO/VICODIN) 5-325 MG tablet Take 1 tablet by mouth every 6 (six) hours as needed for moderate pain or severe pain. 14 tablet 0  . LORazepam (ATIVAN) 1 MG tablet Take 1 mg by mouth daily as needed for anxiety.     . metFORMIN (GLUCOPHAGE-XR) 500 MG 24 hr tablet Take 1,000 mg by mouth 2 (two) times daily with a meal.    . omeprazole (PRILOSEC) 40 MG capsule Take 40 mg by mouth every morning.    .  potassium chloride SA (K-DUR,KLOR-CON) 20 MEQ tablet Take 1 tablet (20 mEq total) by mouth daily. 30 tablet 0  . rOPINIRole (REQUIP) 1 MG tablet Take 1 mg by mouth at bedtime.     Marland Kitchen zolpidem (AMBIEN) 5 MG tablet Take 5 mg by mouth at bedtime.      No current facility-administered medications on file prior to visit.    Allergies  Allergen Reactions  . Paroxetine Hcl Other (See Comments)    Suicidal thoughts    Family History  Problem Relation Age of Onset  . Breast cancer Maternal Uncle   . Diabetes Father     BP 130/80 (BP Location: Right Arm, Patient Position: Sitting, Cuff Size: Normal)   Pulse 93   Ht 5' 3.5" (1.613 m)   Wt 173 lb 12.8 oz (78.8 kg)   SpO2 97%   BMI 30.30 kg/m    Review of Systems denies recent weight loss, chest pain, sob, n/v, and depression.        Objective:   Physical Exam VITAL SIGNS:  See vs page GENERAL: no distress Pulses: dorsalis pedis intact bilat.   MSK: no deformity of the feet CV: no leg edema Skin:  no ulcer on the feet.  normal color and temp on the feet. Neuro: sensation is intact to touch on the feet  Lab Results  Component Value Date   HGBA1C 13.3 (A) 12/26/2020   I have reviewed outside records, and summarized: Pt was noted to have elevated A1c, and referred here.  RLS, GERD, and dyslipidemia were also addressed.        Assessment & Plan:  Insulin-requiring type 2 DM, with GP: uncontrolled  Patient Instructions  good diet and exercise significantly improve the control of your diabetes.  please let me know if you wish to be referred to a dietician.  high blood sugar is very risky to your health.  you should see an eye doctor and dentist every year.  It is very important to get all recommended vaccinations.  Controlling your blood pressure and cholesterol drastically reduces the damage diabetes does to your body.  Those who smoke should quit.  Please discuss these with your doctor.  check your blood sugar twice a day.  vary  the time of day when you check, between before the 3 meals, and at bedtime.  also check if you have symptoms of your blood sugar being too high or too low.  please keep a record of the readings and bring it to your next appointment here (or you can bring the meter itself).  You can write it on any piece of paper.  please call us sooner if your blood sugar goes below 70, or if most of your readings are over 200.   We will need to take this complex situation in stages. For now, please stop taking the metformin and glimepiride, and:  I have sent a prescription to your pharmacy, to change basaglar to Lantus, 40 units each morning.   Please call or message [place] next week, to tell us how the blood sugar is doing Please come back for a follow-up appointment in 3 months.

## 2020-12-28 DIAGNOSIS — E119 Type 2 diabetes mellitus without complications: Secondary | ICD-10-CM | POA: Insufficient documentation

## 2020-12-29 ENCOUNTER — Other Ambulatory Visit: Payer: Self-pay | Admitting: Internal Medicine

## 2020-12-29 DIAGNOSIS — N644 Mastodynia: Secondary | ICD-10-CM

## 2021-03-04 DIAGNOSIS — Z86010 Personal history of colonic polyps: Secondary | ICD-10-CM | POA: Diagnosis not present

## 2021-03-04 DIAGNOSIS — K219 Gastro-esophageal reflux disease without esophagitis: Secondary | ICD-10-CM | POA: Diagnosis not present

## 2021-03-04 DIAGNOSIS — K589 Irritable bowel syndrome without diarrhea: Secondary | ICD-10-CM | POA: Diagnosis not present

## 2021-03-04 DIAGNOSIS — K3184 Gastroparesis: Secondary | ICD-10-CM | POA: Diagnosis not present

## 2021-03-27 ENCOUNTER — Ambulatory Visit: Payer: 59 | Admitting: Endocrinology

## 2021-06-11 ENCOUNTER — Other Ambulatory Visit: Payer: Self-pay

## 2021-06-11 ENCOUNTER — Ambulatory Visit: Payer: BC Managed Care – PPO | Admitting: Endocrinology

## 2021-06-11 VITALS — BP 130/80 | HR 90 | Ht 63.5 in | Wt 166.0 lb

## 2021-06-11 DIAGNOSIS — G8929 Other chronic pain: Secondary | ICD-10-CM | POA: Diagnosis not present

## 2021-06-11 DIAGNOSIS — E139 Other specified diabetes mellitus without complications: Secondary | ICD-10-CM | POA: Diagnosis not present

## 2021-06-11 DIAGNOSIS — R519 Headache, unspecified: Secondary | ICD-10-CM | POA: Diagnosis not present

## 2021-06-11 LAB — POCT GLYCOSYLATED HEMOGLOBIN (HGB A1C): Hemoglobin A1C: 13 % — AB (ref 4.0–5.6)

## 2021-06-11 MED ORDER — TRESIBA FLEXTOUCH 100 UNIT/ML ~~LOC~~ SOPN: 50.0000 [IU] | PEN_INJECTOR | Freq: Every day | SUBCUTANEOUS | 3 refills | Status: DC

## 2021-06-11 NOTE — Progress Notes (Signed)
Subjective:    Patient ID: Diamond [name], female    DOB: 1966-07-08, 55 y.o.   MRN: [id number]  HPI Pt returns for f/u of diabetes mellitus: DM type: Insulin-requiring type 2 Dx'ed: 4132 Complications:  PN and GP Therapy: insulin since 2020 GDM: never DKA: never Severe hypoglycemia: never Pancreatitis: never Pancreatic imaging: pancreatitis on 2018 CT SDOH: therapy limited by noncompliance.  Other: She had pancreatitis in 2018, due to hypertriglyceridemia. Interval history: She takes 50 units per day.  She has not recently checked cbg.  She misses the insulin approx QOD.  She says this is because she forgets.   Past Medical History:  Diagnosis Date   Breast mass    Axillary lump left side   Diabetes mellitus without complication ([facility])    Endometriosis    history of.   GERD (gastroesophageal reflux disease)    Headache    frequent , now less with Prednisone use of 4 weeks.   History of colon polyps    Hyperlipidemia    Hypertension    Insomnia    Pancreatitis 2017   PONV (postoperative nausea and vomiting)    Restless leg syndrome     Past Surgical History:  Procedure Laterality Date   ABDOMINAL HYSTERECTOMY     "fibroids, endometriosis"/left oophorectomy   ARTERY BIOPSY Right 04/07/2016   Procedure: BIOPSY TEMPORAL ARTERY RIGHT;  Surgeon: [name], MD;  [facility];  Service: General;  Laterality: Right;   bladder tack     COLONOSCOPY WITH PROPOFOL N/A 06/04/2014   Procedure: COLONOSCOPY WITH PROPOFOL;  Surgeon: Garlan Fair, MD;  [facility];  Service: Endoscopy;  Laterality: N/A;, colon polyps removed.   DIAGNOSTIC LAPAROSCOPY     - endometriosis, ovarian cyst, right oophorectomy   RADIOACTIVE SEED GUIDED EXCISIONAL BREAST BIOPSY Right 05/15/2019   Procedure: RADIOACTIVE SEED GUIDED EXCISIONAL RIGHT BREAST BIOPSY;  Surgeon: [name], MD;  Location: [facility];  Service: General;  Laterality: Right;   RECTOCELE REPAIR     TMJ  ARTHROPLASTY     retained hardware   TONSILLECTOMY      Social History   Socioeconomic History   Marital status: Divorced    Spouse name: Not on file   Number of children: Not on file   Years of education: Not on file   Highest education level: Not on file  Occupational History   Not on file  Tobacco Use   Smoking status: Heavy Smoker    Packs/day: 0.50    Years: 30.00    Pack years: 15.00    Types: Cigarettes    Last attempt to quit: 03/23/2013    Years since quitting: 8.2   Smokeless tobacco: Never   Tobacco comments:    Quit  7'14, 04-06-16" occ. has a rare ciagarette with stress situations"  Substance and Sexual Activity   Alcohol use: Yes    Comment: occ. social,rare   Drug use: No   Sexual activity: Yes  Other Topics Concern   Not on file  Social History Narrative   Not on file   Social Determinants of Health   Financial Resource Strain: Not on file  Food Insecurity: Not on file  Transportation Needs: Not on file  Physical Activity: Not on file  Stress: Not on file  Social Connections: Not on file  Intimate Partner Violence: Not on file    Current Outpatient Medications on File Prior to Visit  Medication Sig Dispense Refill   amitriptyline (ELAVIL) 25 MG tablet  Take 25 mg by mouth at bedtime.     estradiol (VIVELLE-DOT) 0.1 MG/24HR patch Place 1 patch onto the skin 2 (two) times a week.     fenofibrate 160 MG tablet Take 1 tablet (160 mg total) by mouth daily. 30 tablet 0   hydrochlorothiazide (HYDRODIURIL) 25 MG tablet Take 25 mg by mouth daily.     HYDROcodone-acetaminophen (NORCO/VICODIN) 5-325 MG tablet Take 1 tablet by mouth every 6 (six) hours as needed for moderate pain or severe pain. 14 tablet 0   LORazepam (ATIVAN) 1 MG tablet Take 1 mg by mouth daily as needed for anxiety.      metFORMIN (GLUCOPHAGE-XR) 500 MG 24 hr tablet Take 1,000 mg by mouth 2 (two) times daily with a meal.     omeprazole (PRILOSEC) 40 MG capsule Take 40 mg by mouth every  morning.     potassium chloride SA (K-DUR,KLOR-CON) 20 MEQ tablet Take 1 tablet (20 mEq total) by mouth daily. 30 tablet 0   rOPINIRole (REQUIP) 1 MG tablet Take 1 mg by mouth at bedtime.      zolpidem (AMBIEN) 5 MG tablet Take 5 mg by mouth at bedtime.      No current facility-administered medications on file prior to visit.    Allergies  Allergen Reactions   Paroxetine Hcl Other (See Comments)    Suicidal thoughts    Family History  Problem Relation Age of Onset   Breast cancer Maternal Uncle    Diabetes Father     BP 130/80 (BP Location: Right Arm, Patient Position: Sitting, Cuff Size: Normal)   Pulse 90   Ht 5' 3.5" (1.613 m)   Wt 166 lb (75.3 kg)   SpO2 97%   BMI 28.94 kg/m   Review of Systems She denies hypoglycemia    Objective:   Physical Exam Pulses: dorsalis pedis intact bilat.   MSK: no deformity of the feet CV: no leg edema Skin:  no ulcer on the feet.  normal color and temp on the feet. Neuro: sensation is intact to touch on the feet.   A1c=13%    Assessment & Plan:  Insulin-requiring type 2 DM: severe exacerbation  Patient Instructions  check your blood sugar twice a day.  vary the time of day when you check, between before the 3 meals, and at bedtime.  also check if you have symptoms of your blood sugar being too high or too low.  please keep a record of the readings and bring it to your next appointment here (or you can bring the meter itself).  You can write it on any piece of paper.  please call us sooner if your blood sugar goes below 70, or if most of your readings are over 200.   I have sent a prescription to your pharmacy, to change Lantus to Tresiba, 40 units each morning.   Please come back for a follow-up appointment in 2-3 months.

## 2021-06-11 NOTE — Patient Instructions (Addendum)
check your blood sugar twice a day.  vary the time of day when you check, between before the 3 meals, and at bedtime.  also check if you have symptoms of your blood sugar being too high or too low.  please keep a record of the readings and bring it to your next appointment here (or you can bring the meter itself).  You can write it on any piece of paper.  please call us sooner if your blood sugar goes below 70, or if most of your readings are over 200.   I have sent a prescription to your pharmacy, to change Lantus to Tresiba, 40 units each morning.   Please come back for a follow-up appointment in 2-3 months.

## 2021-06-16 DIAGNOSIS — I10 Essential (primary) hypertension: Secondary | ICD-10-CM | POA: Diagnosis not present

## 2021-06-16 DIAGNOSIS — H35033 Hypertensive retinopathy, bilateral: Secondary | ICD-10-CM | POA: Diagnosis not present

## 2021-07-06 ENCOUNTER — Other Ambulatory Visit: Payer: Self-pay | Admitting: Internal Medicine

## 2021-07-06 DIAGNOSIS — G44209 Tension-type headache, unspecified, not intractable: Secondary | ICD-10-CM

## 2021-07-22 ENCOUNTER — Other Ambulatory Visit: Payer: BC Managed Care – PPO

## 2021-07-31 ENCOUNTER — Other Ambulatory Visit: Payer: Self-pay

## 2021-07-31 ENCOUNTER — Ambulatory Visit
Admission: RE | Admit: 2021-07-31 | Discharge: 2021-07-31 | Disposition: A | Discharge status: Home / Self Care | Payer: BC Managed Care – PPO | Source: Ambulatory Visit | Attending: Internal Medicine | Admitting: Internal Medicine

## 2021-07-31 DIAGNOSIS — R200 Anesthesia of skin: Secondary | ICD-10-CM | POA: Diagnosis not present

## 2021-07-31 DIAGNOSIS — G44209 Tension-type headache, unspecified, not intractable: Secondary | ICD-10-CM

## 2021-07-31 DIAGNOSIS — R519 Headache, unspecified: Secondary | ICD-10-CM | POA: Diagnosis not present

## 2021-08-03 ENCOUNTER — Other Ambulatory Visit: Payer: BC Managed Care – PPO

## 2021-08-27 DIAGNOSIS — G2581 Restless legs syndrome: Secondary | ICD-10-CM

## 2021-08-27 DIAGNOSIS — I10 Essential (primary) hypertension: Secondary | ICD-10-CM
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
## 2021-09-01 ENCOUNTER — Ambulatory Visit: Payer: BC Managed Care – PPO | Admitting: Psychiatry

## 2021-09-01 ENCOUNTER — Other Ambulatory Visit: Payer: Self-pay

## 2021-09-01 ENCOUNTER — Encounter: Payer: Self-pay | Admitting: Psychiatry

## 2021-09-01 VITALS — BP 129/78 | HR 92 | Ht 63.0 in | Wt 166.0 lb

## 2021-09-01 DIAGNOSIS — M5410 Radiculopathy, site unspecified: Secondary | ICD-10-CM

## 2021-09-01 DIAGNOSIS — M5416 Radiculopathy, lumbar region: Secondary | ICD-10-CM | POA: Diagnosis not present

## 2021-09-01 DIAGNOSIS — M5481 Occipital neuralgia: Secondary | ICD-10-CM

## 2021-09-01 NOTE — Patient Instructions (Addendum)
Plan: MRI of neck and lower back 2.   Increase amitriptyline to 1.5 tablets at bedtime for one week. If tolerating well can increase to 2 tablets at bedtime. Let me know if you tolerate this well without side effects and I can send in a higher prescription.

## 2021-09-02 ENCOUNTER — Ambulatory Visit: Payer: BC Managed Care – PPO | Admitting: Endocrinology

## 2021-09-07 ENCOUNTER — Telehealth: Payer: Self-pay | Admitting: Psychiatry

## 2021-09-07 NOTE — Telephone Encounter (Signed)
I have printed and faxed the notes requested to Dr Wonda Amis office.

## 2021-09-07 NOTE — Telephone Encounter (Signed)
[facility] Lonn [place]) called on behalf of Dr. Laurann Montana. Received a fax with visit notes, but half of the fax is missing. If there is any problems can call back.  Can fax to: [phone] put attn to Dr. Laurann Montana

## 2021-10-06 ENCOUNTER — Telehealth: Payer: Self-pay | Admitting: Psychiatry

## 2021-10-06 NOTE — Telephone Encounter (Signed)
[employer] Josem Kaufmann: [id number] (exp. 09/10/21 to 10/09/21) patient is scheduled at [facility] for 10/07/21.

## 2021-10-07 ENCOUNTER — Ambulatory Visit: Payer: BC Managed Care – PPO

## 2021-10-07 DIAGNOSIS — M5410 Radiculopathy, site unspecified: Secondary | ICD-10-CM

## 2021-10-07 DIAGNOSIS — M5416 Radiculopathy, lumbar region: Secondary | ICD-10-CM | POA: Diagnosis not present

## 2021-10-08 DIAGNOSIS — Z794 Long term (current) use of insulin: Secondary | ICD-10-CM | POA: Diagnosis not present

## 2021-10-08 DIAGNOSIS — G2581 Restless legs syndrome: Secondary | ICD-10-CM | POA: Diagnosis not present

## 2021-10-08 DIAGNOSIS — I10 Essential (primary) hypertension: Secondary | ICD-10-CM | POA: Diagnosis not present

## 2021-10-08 DIAGNOSIS — Z0000 Encounter for general adult medical examination without abnormal findings: Secondary | ICD-10-CM | POA: Diagnosis not present

## 2021-10-08 DIAGNOSIS — E781 Pure hyperglyceridemia: Secondary | ICD-10-CM | POA: Diagnosis not present

## 2021-10-08 DIAGNOSIS — E782 Mixed hyperlipidemia: Secondary | ICD-10-CM | POA: Diagnosis not present

## 2021-10-08 DIAGNOSIS — E1169 Type 2 diabetes mellitus with other specified complication: Secondary | ICD-10-CM | POA: Diagnosis not present

## 2021-10-12 ENCOUNTER — Telehealth: Payer: Self-pay | Admitting: *Deleted

## 2021-10-12 DIAGNOSIS — M5410 Radiculopathy, site unspecified: Secondary | ICD-10-CM

## 2021-10-12 DIAGNOSIS — M5416 Radiculopathy, lumbar region: Secondary | ICD-10-CM

## 2021-10-12 NOTE — Telephone Encounter (Signed)
Pt returned my call and we discussed results. She verbalized understanding and is agreeable to physical therapy I have placed the order.   Pt also reports she tried the 1.5 dosage of the amitriptyline and did not see any benefit prefers to stays at 1 tablet right now.

## 2021-10-12 NOTE — Telephone Encounter (Signed)
LVM requesting call back for MRI results. 

## 2021-10-16 DIAGNOSIS — R102 Pelvic and perineal pain: Secondary | ICD-10-CM | POA: Diagnosis not present

## 2021-10-27 ENCOUNTER — Ambulatory Visit: Payer: BC Managed Care – PPO | Attending: Psychiatry | Admitting: Physical Therapy

## 2021-10-27 ENCOUNTER — Encounter: Payer: Self-pay | Admitting: Physical Therapy

## 2021-10-27 ENCOUNTER — Other Ambulatory Visit: Payer: Self-pay

## 2021-10-27 DIAGNOSIS — M5416 Radiculopathy, lumbar region: Secondary | ICD-10-CM | POA: Diagnosis not present

## 2021-10-27 DIAGNOSIS — M545 Low back pain, unspecified: Secondary | ICD-10-CM | POA: Diagnosis not present

## 2021-10-27 DIAGNOSIS — M542 Cervicalgia: Secondary | ICD-10-CM | POA: Diagnosis not present

## 2021-10-27 DIAGNOSIS — M5410 Radiculopathy, site unspecified: Secondary | ICD-10-CM | POA: Diagnosis not present

## 2021-10-27 NOTE — Therapy (Signed)
OUTPATIENT PHYSICAL THERAPY THORACOLUMBAR EVALUATION   Patient Name: Diamond [name] MRN: [id number] DOB:06-Apr-1966, 56 y.o., female Today's Date: 10/27/2021   PT End of Session - 10/27/21 1540     Visit Number 1    PT Start Time 0205    PT Stop Time 0236    PT Time Calculation (min) 31 min             Past Medical History:  Diagnosis Date   Breast mass    Axillary lump left side   Diabetes mellitus without complication (HCC)    Endometriosis    history of.   GERD (gastroesophageal reflux disease)    Headache    frequent , now less with Prednisone use of 4 weeks.   History of colon polyps    Hyperlipidemia    Hypertension    Insomnia    Pancreatitis 2017   PONV (postoperative nausea and vomiting)    Restless leg syndrome    Past Surgical History:  Procedure Laterality Date   ABDOMINAL HYSTERECTOMY     "fibroids, endometriosis"/left oophorectomy   ARTERY BIOPSY Right 04/07/2016   Procedure: BIOPSY TEMPORAL ARTERY RIGHT;  Surgeon: [name], MD;  Location: [facility];  Service: General;  Laterality: Right;   bladder tack     COLONOSCOPY WITH PROPOFOL N/A 06/04/2014   Procedure: COLONOSCOPY WITH PROPOFOL;  Surgeon: Garlan Fair, MD;  Location: [facility];  Service: Endoscopy;  Laterality: N/A;, colon polyps removed.   DIAGNOSTIC LAPAROSCOPY     - endometriosis, ovarian cyst, right oophorectomy   RADIOACTIVE SEED GUIDED EXCISIONAL BREAST BIOPSY Right 05/15/2019   Procedure: RADIOACTIVE SEED GUIDED EXCISIONAL RIGHT BREAST BIOPSY;  Surgeon: [name], MD;  Location: [place];  Service: General;  Laterality: Right;   RECTOCELE REPAIR     TMJ ARTHROPLASTY     retained hardware   TONSILLECTOMY     Patient Active Problem List   Diagnosis Date Noted   Hypertension 08/27/2021   Restless leg 08/27/2021   Diabetes ([place]) 12/28/2020   Hypertriglyceridemia 05/31/2016   Pancreatitis 05/30/2016   Acute pancreatitis 05/30/2016   IBS (irritable bowel syndrome)  05/30/2016    PCP: Lavone Orn, MD  REFERRING PROVIDER: Genia Harold, MD  REFERRING DIAG: Referral diagnosis: Lumbar radiculopathy [M54.16], Radiculopathy affecting upper extremity [M54.10]  THERAPY DIAG:  Cervicalgia  Low back pain, unspecified back pain laterality, unspecified chronicity, unspecified whether sciatica present  ONSET DATE: Chronic neck and back pain, worsening radicular sxs starting in 2022  SUBJECTIVE:  Diamond [name] is a 56 y.o. female who presents to clinic with chief complaint of chronic lumbar and cervical pain with radiular sxs.  MOI/History of condition:  Chronic neck and back pain.  Started having a a very sensitive area in the back of her head which is difficult to lay/put pressure on on.  This started after taking the COVID shot (2022).  She is having progressive weakness and pain in her L arm.    CT scan of head is (-).  She is followed by neurology.   Chronic back pain with bil radiating leg pain.  She reports that she has "pulled her back out" multiple times.     Red flags:  Endorses BB changes and saddle anesthesia (saddle anesthesia starting in early January which has progressively gotten worse.  Denies urinary retention or incontinence.  Endorses some increased constipation recently.)   Pertinent past history:  Diabetes  DIAGNOSTIC FINDINGS:    MRI lumbar spine (without) demonstrating: - At L5-S1: disc bulging and facet hypertrophy with mild right and moderate left foraminal stenosis.   MRI cervical spine (without) demonstrating: - Mild degenerative changes at C3-4, C4-5 and C6-7.  Mild right foraminal stenosis at C6-7.  OBJECTIVE:   On reports of saddle anesthesia, exam shifted to potential cauda equina.  Pt does not endorse urinary retention or  incontinence.   She is hypo reflexive R>L at quad and achilles tendon  ASSESSMENT:    CLINICAL IMPRESSION: Diamond [name] is a 56 y.o. female who presents to clinic with some red flag signs of cauda equina.  She reports that she has had increasing saddle anesthesia with onset ~3-4 weeks ago.  She also has reduced LE reflexes on exam.  She does not report urinary retention or incontinence.     She had an MRI in January which was (-) for signs of cauda equina, but she has not discussed the saddle anesthesia with her neurologist yet.  I recommended that she contact her neurologist immediately and if she is unable to get in contact or has worsening sxs to go to the ED; she agrees to plan.  I will send her neurologist a note as well.  When she is cleared by neurology she can resume PT at her convenience.   REHAB POTENTIAL: full eval not completed  CLINICAL DECISION MAKING: low  EVALUATION COMPLEXITY: Low     PLAN: PT FREQUENCY: Pt can return PRN after being cleared for cauda equina    Shearon Balo PT, DPT 10/27/2021, 3:41 PM

## 2021-10-28 ENCOUNTER — Telehealth: Payer: Self-pay | Admitting: Psychiatry

## 2021-10-28 ENCOUNTER — Encounter (HOSPITAL_COMMUNITY): Payer: Self-pay | Admitting: *Deleted

## 2021-10-28 ENCOUNTER — Other Ambulatory Visit: Payer: Self-pay

## 2021-10-28 ENCOUNTER — Emergency Department (HOSPITAL_COMMUNITY)
Admission: EM | Admit: 2021-10-28 | Discharge: 2021-10-28 | Discharge status: Against Medical Advice | Payer: BC Managed Care – PPO | Attending: Emergency Medicine | Admitting: Emergency Medicine

## 2021-10-28 DIAGNOSIS — Z5321 Procedure and treatment not carried out due to patient leaving prior to being seen by health care provider: Secondary | ICD-10-CM | POA: Insufficient documentation

## 2021-10-28 DIAGNOSIS — R1030 Lower abdominal pain, unspecified: Secondary | ICD-10-CM | POA: Diagnosis not present

## 2021-10-28 DIAGNOSIS — M79606 Pain in leg, unspecified: Secondary | ICD-10-CM | POA: Diagnosis not present

## 2021-10-28 DIAGNOSIS — R102 Pelvic and perineal pain: Secondary | ICD-10-CM | POA: Diagnosis not present

## 2021-10-28 DIAGNOSIS — R531 Weakness: Secondary | ICD-10-CM | POA: Insufficient documentation

## 2021-10-28 DIAGNOSIS — R150 Incomplete defecation: Secondary | ICD-10-CM | POA: Insufficient documentation

## 2021-10-28 NOTE — ED Provider Triage Note (Signed)
Emergency Medicine Provider Triage Evaluation Note  Concettina Leth [place] , a 56 y.o. female  was evaluated in triage.  Pt complains of groin and leg pain for the last 2 to 3-week.  Patient has history of back pain, neck pain, leg pain.  Patient has been seen by neurologist for this with MRI conducted in January which showed bulging of L5-S1 discs with foraminal stenosis.  Patient here today with new symptoms to include for lower extremity weakness, pain in her groin as well as difficulty stooling and voiding.  Patient denies any groin numbness, denies using the bathroom on her self.  Patient was able to ambulate around the room at provider request.  Patient sent here by her neurologist for possible MRI to further evaluate new symptoms.  Review of Systems  Positive: Groin pain, lower extremity weakness, difficulty stooling/voiding Negative: Nausea, vomiting, numbness, chest pain, shortness of breath  Physical Exam  BP (!) 136/99 (BP Location: Right Arm)    Pulse 93    Temp 99.2 F (37.3 C) (Oral)    Resp 16    Ht 5\' 3"  (1.6 m)    Wt 75.3 kg    SpO2 98%    BMI 29.41 kg/m  Gen:   Awake, no distress   Resp:  Normal effort  MSK:   Moves extremities without difficulty  Other:  No tenderness to palpation of back.  Medical Decision Making  Medically screening exam initiated at 3:40 PM.  Appropriate orders placed.  Eloyse Causey [place] was informed that the remainder of the evaluation will be completed by another provider, this initial triage assessment does not replace that evaluation, and the importance of remaining in the ED until their evaluation is complete.     Azucena Cecil, PA-C 10/28/21 1541

## 2021-10-28 NOTE — Telephone Encounter (Signed)
Called patient and advised her Dr Billey Gosling recommends she go to ED for evaluation of new neurological symptoms. She expressed concern about why she should go. I advised that she needs urgent evaluation which only ED can do. Advised due to her new symptoms she may have pressure on spinal cord and /or nerves that need immediate attention. I answered her questions to her stated satisfaction. She stated she would probably go this afternoon. Patient verbalized understanding, appreciation.

## 2021-10-28 NOTE — Telephone Encounter (Signed)
Called patient who stated her legs are weaker, with pain going down legs mainly on right. Yesterday she saw Therapist who could barely get reflex on right  knee and ankle, reflex on left wasn't normal. She has new symptom like pins and needles in her pelvic/groin area. She didn't received any therapy yesterday due to her symptoms. She saw GYN who said everything is fine. She's struggling to get bm to come out. GI told her to do metamucil, but her stool is soft. She has to strain hard. She'll lose a split second of use of her left arm. These are new symptoms. She is concerned because the therapist told her with symptoms in her groin she should go to the ED. I advised will let Dr Billey Gosling know and call her back. Patient verbalized understanding, appreciation.

## 2021-10-28 NOTE — ED Triage Notes (Signed)
Pain in her pelvis neck and lower back pain for months  she sees a neurologist  for the same  lmp none

## 2021-10-28 NOTE — Telephone Encounter (Signed)
I'd recommend she go to the ED for evaluation since she seems to have developed these new neurologic symptoms so suddenly

## 2021-10-28 NOTE — Telephone Encounter (Signed)
Pt has called to report that she shared with her physical therapist that her symptoms are worse.  PT wants to hold off until the worsening symptoms have been addressed by Dr Billey Gosling, please call.

## 2021-10-29 ENCOUNTER — Emergency Department (HOSPITAL_COMMUNITY): Payer: BC Managed Care – PPO

## 2021-10-29 ENCOUNTER — Emergency Department (HOSPITAL_COMMUNITY)
Admission: EM | Admit: 2021-10-29 | Discharge: 2021-10-29 | Disposition: A | Discharge status: Home / Self Care | Payer: BC Managed Care – PPO | Attending: Emergency Medicine | Admitting: Emergency Medicine

## 2021-10-29 DIAGNOSIS — Z794 Long term (current) use of insulin: Secondary | ICD-10-CM | POA: Insufficient documentation

## 2021-10-29 DIAGNOSIS — M545 Low back pain, unspecified: Secondary | ICD-10-CM | POA: Diagnosis not present

## 2021-10-29 DIAGNOSIS — M47816 Spondylosis without myelopathy or radiculopathy, lumbar region: Secondary | ICD-10-CM | POA: Diagnosis not present

## 2021-10-29 DIAGNOSIS — R102 Pelvic and perineal pain: Secondary | ICD-10-CM | POA: Diagnosis not present

## 2021-10-29 DIAGNOSIS — M79604 Pain in right leg: Secondary | ICD-10-CM | POA: Insufficient documentation

## 2021-10-29 LAB — BASIC METABOLIC PANEL
Anion gap: 8 (ref 5–15)
BUN: 11 mg/dL (ref 6–20)
CO2: 24 mmol/L (ref 22–32)
Calcium: 9.3 mg/dL (ref 8.9–10.3)
Chloride: 103 mmol/L (ref 98–111)
Creatinine, Ser: 0.52 mg/dL (ref 0.44–1.00)
GFR, Estimated: >60 mL/min (ref >60)
Glucose, Bld: 325 mg/dL — ABNORMAL HIGH (ref 70–99)
Potassium: 4 mmol/L (ref 3.5–5.1)
Sodium: 135 mmol/L (ref 135–145)

## 2021-10-29 LAB — CBC WITH DIFFERENTIAL/PLATELET
Abs Immature Granulocytes: 0.02 10*3/uL (ref 0.00–0.07)
Basophils Absolute: 0.1 10*3/uL (ref 0.0–0.1)
Basophils Relative: 1 %
Eosinophils Absolute: 0.1 10*3/uL (ref 0.0–0.5)
Eosinophils Relative: 1 %
HCT: 48.7 % — ABNORMAL HIGH (ref 36.0–46.0)
Hemoglobin: 16.3 g/dL — ABNORMAL HIGH (ref 12.0–15.0)
Immature Granulocytes: 0 %
Lymphocytes Relative: 39 %
Lymphs Abs: 3.8 10*3/uL (ref 0.7–4.0)
MCH: 28.3 pg (ref 26.0–34.0)
MCHC: 33.5 g/dL (ref 30.0–36.0)
MCV: 84.7 fL (ref 80.0–100.0)
Monocytes Absolute: 0.6 10*3/uL (ref 0.1–1.0)
Monocytes Relative: 7 %
Neutro Abs: 5.1 10*3/uL (ref 1.7–7.7)
Neutrophils Relative %: 52 %
Platelets: 250 10*3/uL (ref 150–400)
RBC: 5.75 MIL/uL — ABNORMAL HIGH (ref 3.87–5.11)
RDW: 13.2 % (ref 11.5–15.5)
WBC: 9.8 10*3/uL (ref 4.0–10.5)
nRBC: 0 % (ref 0.0–0.2)

## 2021-10-29 NOTE — Discharge Instructions (Signed)
Follow up with your neurologist.

## 2021-10-29 NOTE — ED Notes (Signed)
Pt states understanding of dc instructions, importance of follow up. Pt denies questions or concerns and declined transportation assistance upon dc. Pt ambulated w/ a steady gait w/o need for assistance. No belongings left in room upon dc. ? ?

## 2021-10-29 NOTE — Telephone Encounter (Signed)
Pt went to the ED, stayed 3 hours and left because wait was very long. Would like a call from the nurse to discuss if still need to go to the ED.  Pt said symptoms are still the same. Advised pt if symptoms are the same or worsening would need to go the ED.

## 2021-10-29 NOTE — ED Triage Notes (Signed)
Patient sent by neurologist d/t bilateral leg pain, lower back pain, neck pain and pelvic pain. She reports symptoms have been occurring for several months. She reports having an MRI done which showed disc bulges.

## 2021-10-29 NOTE — Telephone Encounter (Signed)
There's really nothing I can do in the clinic to evaluate her new symptoms. It sounds like she needs urgent spine imaging to assess for new spine compression, which can only be done in the ED. We can only strongly encourage her to go though, ultimately it's her choice if she decides to go or not

## 2021-10-29 NOTE — ED Provider Triage Note (Signed)
Emergency Medicine Provider Triage Evaluation Note  Diamond [name] , a 56 y.o. female  was evaluated in triage.  Pt complains of groin and leg pain for the last 2 to 3-week.  Patient has history of back pain, neck pain, leg pain.  Patient has been seen by neurologist for this with MRI conducted in January which showed bulging of L5-S1 discs with foraminal stenosis.  Patient here today with new symptoms to include for lower extremity weakness, pain in her groin as well as difficulty stooling and voiding.  Patient denies any groin numbness, denies using the bathroom on her self.  Patient was able to ambulate around the room at provider request.  Patient sent here by her neurologist for possible MRI to further evaluate new symptoms. Patient seen at [facility] day prior, left due to wait.  Review of Systems  Positive: Groin pain/numb, lower extremity weakness, difficulty stooling/voiding Negative: Nausea, vomiting, chest pain, shortness of breath  Physical Exam  BP (!) 147/86 (BP Location: Left Arm)    Pulse 92    Temp 98.3 F (36.8 C) (Oral)    Resp 18    SpO2 98%  Gen:   Awake, no distress   Resp:  Normal effort  MSK:   Moves extremities without difficulty  Other:    Medical Decision Making  Medically screening exam initiated at 12:38 PM.  Appropriate orders placed.  Odalys [place] was informed that the remainder of the evaluation will be completed by another provider, this initial triage assessment does not replace that evaluation, and the importance of remaining in the ED until their evaluation is complete.     Azucena Cecil, PA-C 10/29/21 1239

## 2021-10-29 NOTE — Telephone Encounter (Signed)
Spoke with patient and advised her of Dr [employer]. She stated she is at [facility] ED now. She stated her pain got so bad she had her daughter take her to ED. I advised I'll let Dr Billey Gosling know. Patient verbalized understanding, appreciation.

## 2021-10-29 NOTE — ED Provider Notes (Signed)
[place] DEPT Provider Note   CSN: [id number] Arrival date & time: 10/29/21  1139     History  No chief complaint on file.   Diamond [name] is a 56 y.o. female.  56 year old female presents with several week history of pain to her right lower extremity.  Pain seems be localized to the right sciatic notch.  Was seen last month for similar symptoms had MRI of her L-spine which did not show any acute evidence of cord compression.  Patient was given a PT today and the therapist had trouble getting her patellar reflex.  Patient denies any bowel or bladder dysfunction.  No recent history of trauma.  Is able to walk without assistance.  Called her neurologist and those notes were reviewed and was sent here for an MRI of her lumbar spine.      Home Medications Prior to Admission medications   Medication Sig Start Date End Date Taking? Authorizing Provider  amitriptyline (ELAVIL) 25 MG tablet Take 25 mg by mouth at bedtime.    [provider]  estradiol (VIVELLE-DOT) 0.1 MG/24HR patch Place 1 patch onto the skin 2 (two) times a week. 03/27/19   [provider]  fenofibrate 160 MG tablet Take 1 tablet (160 mg total) by mouth daily. 06/04/16   Johnson, Clanford L, MD  gabapentin (NEURONTIN) 300 MG capsule Take 300 mg by mouth 3 (three) times daily. 06/18/21   [provider]  insulin degludec (TRESIBA FLEXTOUCH) 100 UNIT/ML FlexTouch Pen Inject 50 Units into the skin daily. 06/11/21   Renato Shin, MD  LORazepam (ATIVAN) 1 MG tablet Take 1 mg by mouth daily as needed for anxiety.  03/29/19   [provider]  omeprazole (PRILOSEC) 40 MG capsule Take 40 mg by mouth every morning.    [provider]  rOPINIRole (REQUIP) 1 MG tablet Take 1 mg by mouth at bedtime.     [provider]  telmisartan (MICARDIS) 40 MG tablet Take 40 mg by mouth daily. 08/29/21   [provider]  zolpidem (AMBIEN) 5 MG tablet Take  5 mg by mouth at bedtime.     [provider]      Allergies    Paroxetine hcl    Review of Systems   Review of Systems  All other systems reviewed and are negative.  Physical Exam Updated Vital Signs BP (!) 146/77 (BP Location: Right Arm)    Pulse 80    Temp 98.3 F (36.8 C) (Oral)    Resp 18    SpO2 99%  Physical Exam Vitals and nursing note reviewed.  Constitutional:      General: She is not in acute distress.    Appearance: Normal appearance. She is well-developed. She is not toxic-appearing.  HENT:     Head: Normocephalic and atraumatic.  Eyes:     General: Lids are normal.     Conjunctiva/sclera: Conjunctivae normal.     Pupils: Pupils are equal, round, and reactive to light.  Neck:     Thyroid: No thyroid mass.     Trachea: No tracheal deviation.  Cardiovascular:     Rate and Rhythm: Normal rate and regular rhythm.     Heart sounds: Normal heart sounds. No murmur heard.   No gallop.  Pulmonary:     Effort: Pulmonary effort is normal. No respiratory distress.     Breath sounds: Normal breath sounds. No stridor. No decreased breath sounds, wheezing, rhonchi or rales.  Abdominal:  General: There is no distension.     Palpations: Abdomen is soft.     Tenderness: There is no abdominal tenderness. There is no rebound.  Musculoskeletal:        General: No tenderness. Normal range of motion.     Cervical back: Normal range of motion and neck supple.       Legs:  Skin:    General: Skin is warm and dry.     Findings: No abrasion or rash.  Neurological:     Mental Status: She is alert and oriented to person, place, and time. Mental status is at baseline.     GCS: GCS eye subscore is 4. GCS verbal subscore is 5. GCS motor subscore is 6.     Cranial Nerves: No cranial nerve deficit.     Sensory: No sensory deficit.     Motor: Motor function is intact.     Coordination: Coordination is intact.     Gait: Gait is intact.  Psychiatric:        Attention and  Perception: Attention normal.        Speech: Speech normal.        Behavior: Behavior normal.    ED Results / Procedures / Treatments   Labs (all labs ordered are listed, but only abnormal results are displayed) Labs Reviewed  CBC WITH DIFFERENTIAL/PLATELET - Abnormal; Notable for the following components:      Result Value   RBC 5.75 (*)    Hemoglobin 16.3 (*)    HCT 48.7 (*)    All other components within normal limits  BASIC METABOLIC PANEL - Abnormal; Notable for the following components:   Glucose, Bld 325 (*)    All other components within normal limits    EKG None  Radiology MR LUMBAR SPINE WO CONTRAST  Result Date: 10/29/2021 CLINICAL DATA:  Low back pain, symptoms persist with greater than 6 weeks treatment; progression of low back pain symptoms. Additional history provided: Bilateral leg pain, low back pain, neck pain, pelvic pain. Patient reports symptoms have been present for several months. Difficulty ambulating. EXAM: MRI LUMBAR SPINE WITHOUT CONTRAST TECHNIQUE: Multiplanar, multisequence MR imaging of the lumbar spine was performed. No intravenous contrast was administered. COMPARISON:  Lumbar spine MRI 10/07/2021 FINDINGS: Segmentation: For the purposes of this dictation, five lumbar vertebrae are assumed and the caudal most well-formed intervertebral disc is designated L5-S1. Alignment:  No significant spondylolisthesis. Vertebrae: No lumbar vertebral compression fracture. Small Schmorl node within the L5 superior endplate. No significant marrow edema or focal suspicious osseous lesion. Focal fat versus tiny hemangiomas at multiple levels. Conus medullaris and cauda equina: Conus extends to the L1-L2 level. No signal abnormality identified within the visualized distal spinal cord. Paraspinal and other soft tissues: No abnormality identified within included portions of the abdomen/retroperitoneum. Paraspinal soft tissues unremarkable. Disc levels: Unless otherwise stated, the  level by level findings below have not significantly changed from the prior MRI of 10/07/2021. No more than mild disc degeneration at the lumbar or visualized lower thoracic levels. T11-T12: Imaged in the sagittal plane. Not included on axial imaging. Slight disc bulge. No significant spinal canal or foraminal stenosis. T12-L1: Facet arthrosis on the right. No significant disc herniation or stenosis. L1-L2: Slight disc bulge. No significant spinal canal or foraminal stenosis. L2-L3: Small disc bulge. Minimal facet arthrosis. No significant spinal canal or foraminal stenosis. L3-L4: Disc bulge. Superimposed shallow broad-based right foraminal/extraforaminal disc protrusion. Minimal facet arthrosis and ligamentum flavum hypertrophy. Mild right subarticular narrowing  with slight crowding of the descending right L4 nerve root (series 8, image 23). Central canal patent. No significant foraminal stenosis. L4-L5: Disc bulge with mild endplate spurring. Mild facet arthrosis/ligamentum flavum hypertrophy. Mild right subarticular narrowing (with slight crowding of the descending right L5 nerve root) central canal patent. No significant foraminal stenosis. L5-S1: Disc bulge. Endplate spurring greatest along the left aspect of the disc space. Mild facet arthrosis/ligamentum flavum hypertrophy. No significant spinal canal stenosis. Mild/moderate left neural foraminal narrowing. IMPRESSION: 1. Lumbar spondylosis, as outlined and having not significantly changed from the recent prior lumbar spine MRI of 10/07/2021. Findings are most notably as follows. 2. At L3-L4 and L4-L5, there is multifactorial mild right subarticular narrowing (with slight crowding of the descending right L4 and L5 nerve roots, respectively). 3. No significant central canal stenosis within the lumbar spine. 4. Multifactorial mild/moderate left neural foraminal narrowing at L5-S1. 5. No more than mild disc degeneration within the lumbar spine. 6. Small Schmorl  node within the L5 superior endplate. Electronically Signed   By: Kellie Simmering D.O.   On: 10/29/2021 14:35    Procedures Procedures    Medications Ordered in ED Medications - No data to display  ED Course/ Medical Decision Making/ A&P                           Medical Decision Making Old records reviewed. Patient with normal neurological exam here.  No evidence of cauda equina.  MRI today is unchanged from last 1 done on January 18.  Suspect patient has sciatica.  Recommend that patient follow-up with her neurologist.  No indication for admission at this time        Final Clinical Impression(s) / ED Diagnoses Final diagnoses:  None    Rx / DC Orders ED Discharge Orders     None         Lacretia Leigh, MD 10/29/21 1812

## 2021-11-24 ENCOUNTER — Other Ambulatory Visit: Payer: Self-pay

## 2021-11-24 ENCOUNTER — Ambulatory Visit: Payer: BC Managed Care – PPO | Admitting: Endocrinology

## 2021-11-24 VITALS — BP 118/80 | HR 104 | Ht 63.0 in | Wt 160.2 lb

## 2021-11-24 DIAGNOSIS — E139 Other specified diabetes mellitus without complications: Secondary | ICD-10-CM

## 2021-11-24 DIAGNOSIS — E1165 Type 2 diabetes mellitus with hyperglycemia: Secondary | ICD-10-CM | POA: Diagnosis not present

## 2021-11-24 DIAGNOSIS — E1142 Type 2 diabetes mellitus with diabetic polyneuropathy: Secondary | ICD-10-CM

## 2021-11-24 LAB — POCT GLYCOSYLATED HEMOGLOBIN (HGB A1C): Hemoglobin A1C: 11.6 % — AB (ref 4.0–5.6)

## 2021-11-24 MED ORDER — TRESIBA FLEXTOUCH 200 UNIT/ML ~~LOC~~ SOPN: 70.0000 [IU] | PEN_INJECTOR | Freq: Every day | SUBCUTANEOUS | 3 refills | Status: DC

## 2021-11-24 NOTE — Patient Instructions (Addendum)
check your blood sugar twice a day.  vary the time of day when you check, between before the 3 meals, and at bedtime.  also check if you have symptoms of your blood sugar being too high or too low.  please keep a record of the readings and bring it to your next appointment here (or you can bring the meter itself).  You can write it on any piece of paper.  please call us sooner if your blood sugar goes below 70, or if most of your readings are over 200.   ?I have sent a prescription to your pharmacy, to increase the Tresiba to 70 units daily.    ?Please come back for a follow-up appointment in 2 months.   ?

## 2021-11-24 NOTE — Progress Notes (Signed)
? ?Subjective:  ? ? Patient ID: Diamond [name], female    DOB: [birth year], 56 y.o.   MRN: [id number] ? ?HPI ?Pt returns for f/u of diabetes mellitus: ?DM type: Insulin-requiring type 2 ?Dx'ed: 2019 ?Complications:  PN and GP ?Therapy: insulin since 2020 ?GDM: never ?DKA: never ?Severe hypoglycemia: never ?Pancreatitis: 2018, due to hypertriglyceridemia.   ?Pancreatic imaging: pancreatitis on 2018 CT ?SDOH: therapy limited by noncompliance; ins declined CGM ?Other: due to noncompliance, she is not a candidate for multiple daily injections; she once weighed 220 lbs.  ?Interval history: She takes 60 units per day.  she brings a record of her cbg's which I have reviewed today.  Cbg varies from 188-381.  Pt says she seldom misses the insulin.  She says this is because she forgets.   ?Past Medical History:  ?Diagnosis Date  ? Breast mass   ? Axillary lump left side  ? Diabetes mellitus without complication ([place])   ? Endometriosis   ? history of.  ? GERD (gastroesophageal reflux disease)   ? Headache   ? frequent , now less with Prednisone use of 4 weeks.  ? History of colon polyps   ? Hyperlipidemia   ? Hypertension   ? Insomnia   ? Pancreatitis 2017  ? PONV (postoperative nausea and vomiting)   ? Restless leg syndrome   ? ? ?Past Surgical History:  ?Procedure Laterality Date  ? ABDOMINAL HYSTERECTOMY    ? "fibroids, endometriosis"/left oophorectomy  ? ARTERY BIOPSY Right 04/07/2016  ? Procedure: BIOPSY TEMPORAL ARTERY RIGHT;  Surgeon: [name], MD;  Location: [facility];  Service: General;  Laterality: Right;  ? bladder tack    ? COLONOSCOPY WITH PROPOFOL N/A 06/04/2014  ? Procedure: COLONOSCOPY WITH PROPOFOL;  Surgeon: Garlan Fair, MD;  Location: [facility];  Service: Endoscopy;  Laterality: N/A;, colon polyps removed.  ? DIAGNOSTIC LAPAROSCOPY    ? - endometriosis, ovarian cyst, right oophorectomy  ? RADIOACTIVE SEED GUIDED EXCISIONAL BREAST BIOPSY Right 05/15/2019  ? Procedure: RADIOACTIVE SEED GUIDED  EXCISIONAL RIGHT BREAST BIOPSY;  Surgeon: [name], MD;  Location: [facility];  Service: General;  Laterality: Right;  ? New Home    ? TMJ ARTHROPLASTY    ? retained hardware  ? TONSILLECTOMY    ? ? ?Social History  ? ?Socioeconomic History  ? Marital status: Divorced  ?  Spouse name: Not on file  ? Number of children: Not on file  ? Years of education: Not on file  ? Highest education level: Not on file  ?Occupational History  ? Not on file  ?Tobacco Use  ? Smoking status: Every Day  ?  Packs/day: 0.50  ?  Years: 30.00  ?  Pack years: 15.00  ?  Types: Cigarettes  ?  Last attempt to quit: 03/23/2013  ?  Years since quitting: 8.6  ? Smokeless tobacco: Never  ? Tobacco comments:  ?  Quit  7'14, 04-06-16" occ. has a rare ciagarette with stress situations"  ?Substance and Sexual Activity  ? Alcohol use: Yes  ?  Comment: occ. social,rare  ? Drug use: No  ? Sexual activity: Yes  ?Other Topics Concern  ? Not on file  ?Social History Narrative  ? Caffeine- tea daily   ? ?Social Determinants of Health  ? ?Financial Resource Strain: Not on file  ?Food Insecurity: Not on file  ?Transportation Needs: Not on file  ?Physical Activity: Not on file  ?Stress: Not on file  ?Social Connections: Not on file  ?  Intimate Partner Violence: Not on file  ? ? ?Current Outpatient Medications on File Prior to Visit  ?Medication Sig Dispense Refill  ? amitriptyline (ELAVIL) 25 MG tablet Take 25 mg by mouth at bedtime.    ? estradiol (VIVELLE-DOT) 0.1 MG/24HR patch Place 1 patch onto the skin 2 (two) times a week.    ? fenofibrate 160 MG tablet Take 1 tablet (160 mg total) by mouth daily. 30 tablet 0  ? gabapentin (NEURONTIN) 300 MG capsule Take 300 mg by mouth 3 (three) times daily.    ? LORazepam (ATIVAN) 1 MG tablet Take 1 mg by mouth daily as needed for anxiety.     ? omeprazole (PRILOSEC) 40 MG capsule Take 40 mg by mouth every morning.    ? rOPINIRole (REQUIP) 1 MG tablet Take 1 mg by mouth at bedtime.     ? telmisartan (MICARDIS) 40  MG tablet Take 40 mg by mouth daily.    ? zolpidem (AMBIEN) 5 MG tablet Take 5 mg by mouth at bedtime.     ? ?No current facility-administered medications on file prior to visit.  ? ? ?Allergies  ?Allergen Reactions  ? Paroxetine Hcl Other (See Comments)  ?  Suicidal thoughts  ? ? ?Family History  ?Problem Relation Age of Onset  ? Lupus Mother   ? Diabetes Father   ? Hypertension Father   ? Panic disorder Father   ? Breast cancer Maternal Uncle   ? ? ?BP 118/80   Pulse (!) 104   Ht '5\' 3"'$  (1.6 m)   Wt 160 lb 3.2 oz (72.7 kg)   SpO2 98%   BMI 28.38 kg/m?  ? ? ?Review of Systems ?She has lost 6 more lbs.  ?   ?Objective:  ? Physical Exam ?VITAL SIGNS:  See vs page ?GENERAL: no distress ? ? ? ?Lab Results  ?Component Value Date  ? HGBA1C 11.6 (A) 11/24/2021  ? ?   ?Assessment & Plan:  ?Insulin-requiring type 2 DM: uncontrolled.   ? ?Patient Instructions  ?check your blood sugar twice a day.  vary the time of day when you check, between before the 3 meals, and at bedtime.  also check if you have symptoms of your blood sugar being too high or too low.  please keep a record of the readings and bring it to your next appointment here (or you can bring the meter itself).  You can write it on any piece of paper.  please call us sooner if your blood sugar goes below 70, or if most of your readings are over 200.   ?I have sent a prescription to your pharmacy, to increase the Tresiba to 70 units daily.    ?Please come back for a follow-up appointment in 2 months.   ? ? ?

## 2021-12-02 ENCOUNTER — Encounter: Payer: Self-pay | Admitting: Psychiatry

## 2021-12-02 ENCOUNTER — Ambulatory Visit: Payer: BC Managed Care – PPO | Admitting: Psychiatry

## 2021-12-02 VITALS — BP 121/77 | HR 94 | Ht 63.0 in | Wt 159.0 lb

## 2021-12-02 DIAGNOSIS — M5416 Radiculopathy, lumbar region: Secondary | ICD-10-CM | POA: Diagnosis not present

## 2021-12-02 DIAGNOSIS — M5481 Occipital neuralgia: Secondary | ICD-10-CM

## 2021-12-02 MED ORDER — PREGABALIN 25 MG PO CAPS: ORAL_CAPSULE | ORAL | 3 refills | Status: DC

## 2021-12-02 NOTE — Patient Instructions (Signed)
Start Lyrica 25 mg in the morning and evening. Take this for one week, then can increase to 25 mg in the morning and 50 mg at night. Take this for one week, then can increase to 50 mg twice a day. ? ?Return to physical therapy ?

## 2021-12-02 NOTE — Progress Notes (Signed)
? ?  CC:  headaches ? ?Follow-up Visit ? ?Last visit: 09/01/21 ? ?Brief HPI: ?56 year old female with a history of DM2 and gastroparesis who follows in clinic for occipital neuralgia and neck/back pain. ? ?At her last visit, MRI C-spine and L-spine were ordered. Her amitriptyline was increased to 37.5 mg QHS. ? ?Interval History: ?10/07/21 MRI C-spine showed mild right foraminal stenosis at C6-7. MRI L-spine showed mild right and moderate left foraminal stenosis at L5-S1. ? ?She continues to have severe pain shooting down her legs and in her groin. She can't lie on her back at night due to the pain. She presented to the ED 10/29/21 for severe leg pain, saddle anesthesia, and decreased patellar reflex noted by her physical therapist on exam. Repeat MRI L-spine was stable without evidence of spinal cord compression. ? ?She still has significant pain shooting down her left arm. States it feels deep, like her bones are aching. She cannot move her left arm behind her back without triggering severe pain. ? ?She continues to suffer from constant tingling and shooting pains on bilateral occiput. She increased amitriptyline, but this caused nausea and didn't help her pain so she went back down to 25 mg at bedtime. ? ?Only went to PT once, has not gone back yet since her ED visit.  ? ?Prior Therapies                                  ?Elavil 37.5 mg QHS - nausea ?Gabapentin 600 mg TID - drowsiness ?lorazepam ? ?Physical Exam:  ? ?Vital Signs: ?BP 121/77   Pulse 94   Ht '5\' 3"'$  (1.6 m)   Wt 159 lb (72.1 kg)   BMI 28.17 kg/m?  ?GENERAL:  well appearing, in no acute distress, alert  ?SKIN:  Color, texture, turgor normal. No rashes or lesions ?HEAD:  Normocephalic/atraumatic. ?RESP: normal respiratory effort ?MSK:  No gross joint deformities.  ? ?NEUROLOGICAL: ?Mental Status: Alert, oriented to person, place and time, Follows commands, and Speech fluent and appropriate. ?Cranial Nerves: PERRL, face symmetric, no dysarthria, hearing  grossly intact ?Motor: 5/5 arm flexion and extension, 4+/5 left arm abduction (limited due to pain), 5/5 bilateral hip flexion ?Gait: normal-based. ? ?IMPRESSION: ?56 year old female with a history of DM2 and gastroparesis who presents for follow up of occipital neuralgia, neck pain, and back pain. Her MRIs revealed mild right foraminal stenosis at C6-7 and mild/moderate foraminal stenosis at L5-S1. She does not have foraminal narrowing of the left to explain her left arm pain. Discussed possible EMG/NCS, but she would prefer to hold off at this time. It's possible these arm symptoms are more musculoskeletal rather than neurologic. Advised her to continue with physical therapy, which she plans to set up after her office visit today. Will start Lyrica to help with her nerve pain. Discussed occipital nerve block, which she declined at this time. ? ?PLAN: ?-Start Lyrica 25 mg BID, increase by 25 mg weekly up to 50 mg BID ?-Continue physical therapy ?-next steps: consider occipital nerve block, EMG ? ? ?Follow-up: 4 months ? ?I spent a total of 47 minutes on the date of the service. Headache education was done.  Discussed treatment options including preventive medications, and physical therapy. Discussed medication side effects, adverse reactions and drug interactions. Written educational materials and patient instructions outlining all of the above were given. ? ?Genia Harold, MD ?12/02/21 ?4:23 PM ? ?

## 2022-01-26 ENCOUNTER — Ambulatory Visit: Payer: BC Managed Care – PPO | Admitting: Endocrinology

## 2022-01-27 ENCOUNTER — Ambulatory Visit: Payer: BC Managed Care – PPO | Admitting: Internal Medicine

## 2022-01-27 ENCOUNTER — Encounter: Payer: Self-pay | Admitting: Internal Medicine

## 2022-01-27 VITALS — BP 110/82 | HR 89 | Ht 63.0 in | Wt 161.4 lb

## 2022-01-27 DIAGNOSIS — E785 Hyperlipidemia, unspecified: Secondary | ICD-10-CM | POA: Diagnosis not present

## 2022-01-27 DIAGNOSIS — E1165 Type 2 diabetes mellitus with hyperglycemia: Secondary | ICD-10-CM | POA: Diagnosis not present

## 2022-01-27 DIAGNOSIS — E1142 Type 2 diabetes mellitus with diabetic polyneuropathy: Secondary | ICD-10-CM | POA: Diagnosis not present

## 2022-01-27 LAB — POCT GLYCOSYLATED HEMOGLOBIN (HGB A1C): Hemoglobin A1C: 12.2 % — AB (ref 4.0–5.6)

## 2022-01-27 MED ORDER — ONETOUCH DELICA LANCETS 33G MISC: 2 refills | Status: DC

## 2022-01-27 MED ORDER — FREESTYLE LIBRE 3 SENSOR MISC: 1.0000 | 3 refills | Status: DC

## 2022-01-27 MED ORDER — INSULIN PEN NEEDLE 32G X 4 MM MISC: 3 refills | Status: DC

## 2022-01-27 MED ORDER — ONETOUCH VERIO W/DEVICE KIT: PACK | 0 refills | Status: DC

## 2022-01-27 MED ORDER — ONETOUCH VERIO VI STRP: ORAL_STRIP | 3 refills | Status: DC

## 2022-01-27 MED ORDER — NOVOLOG FLEXPEN 100 UNIT/ML ~~LOC~~ SOPN: 8.0000 [IU] | PEN_INJECTOR | Freq: Three times a day (TID) | SUBCUTANEOUS | 3 refills | Status: DC

## 2022-01-27 NOTE — Addendum Note (Signed)
Addended by: Sarina Ill on: 01/27/2022 01:49 PM ? ? Modules accepted: Orders ? ?

## 2022-01-27 NOTE — Patient Instructions (Signed)
Please continue: ?- Tresiba 70 units daily ? ?Please add: ?- Novolog 8-14 units 15 min before meals ? ?Start the CGM. ? ?Please return in 1.5 months with your sugar log.  ? ?

## 2022-01-27 NOTE — Progress Notes (Signed)
Patient ID: Diamond [name], female   DOB: [birth year], 56 y.o.   MRN: [id number] ? ?This visit occurred during the SARS-CoV-2 public health emergency.  Safety protocols were in place, including screening questions prior to the visit, additional usage of staff PPE, and extensive cleaning of exam room while observing appropriate contact time as indicated for disinfecting solutions.  ? ?HPI: ?Diamond [name] is a 56 y.o.-year-old female, returning for follow-up for DM2, dx in 2019, insulin-dependent since 2020, uncontrolled, without long-term complications, but with hyperglycemia. (PN?) ? ?Reviewed HbA1c: ?Lab Results  ?Component Value Date  ? HGBA1C 11.6 (A) 11/24/2021  ? HGBA1C 13.0 (A) 06/11/2021  ? HGBA1C 13.3 (A) 12/26/2020  ? HGBA1C 14.5 (H) 05/09/2019  ? HGBA1C 7.3 (H) 05/30/2016  ? ?Pt is on a regimen of: ?- Tresiba 60 >> 70 units at bedtime ?Tried Metformin  IR and ER >> stopped due inefficacy but also GI symptoms. ? ?Pt does not check sugars - from 10/2021: ?- am: 221, 265-343 ?- 2h after b'fast: n/c ?- before lunch: n/c ?- 2h after lunch: 286, 349 ?- before dinner: n/c ?- 2h after dinner: n/c ?- bedtime: n/c ?- nighttime: n/c ?Lowest sugar was upper 100s; she has hypoglycemia awareness at 70.  ?Highest sugar was 300s. ? ?Glucometer:One Touch  ? ?- no CKD, last BUN/creatinine:  ?Lab Results  ?Component Value Date  ? BUN 11 10/29/2021  ? BUN 12 05/09/2019  ? CREATININE 0.52 10/29/2021  ? CREATININE 0.58 05/09/2019  ?On telmisartan 40 mg daily. ? ?- + HL: last lipids available for review: ?Lab Results  ?Component Value Date  ? TRIG 938 (H) 06/01/2016  ?On fenofibrate 160 mg daily. ? ?- last eye exam was in 2022. No DR reportedly.  ? ?- + numbness and tingling in her feet.  She is on Neurontin 300 mg 3 times a day and Elavil 25 mg at bedtime. ? ?She does have a history of HTN, pancreatitis in 2018 due to hypertriglyceridemia, restless leg syndrome, GERD, headaches, endometriosis. ? ?ROS: ?No increased  urination, blurry vision, nausea, chest pain.  She does have weight loss. ? ?Past Medical History:  ?Diagnosis Date  ? Breast mass   ? Axillary lump left side  ? Diabetes mellitus without complication ([place])   ? Endometriosis   ? history of.  ? GERD (gastroesophageal reflux disease)   ? Headache   ? frequent , now less with Prednisone use of 4 weeks.  ? History of colon polyps   ? Hyperlipidemia   ? Hypertension   ? Insomnia   ? Pancreatitis 2017  ? PONV (postoperative nausea and vomiting)   ? Restless leg syndrome   ? ?Past Surgical History:  ?Procedure Laterality Date  ? ABDOMINAL HYSTERECTOMY    ? "fibroids, endometriosis"/left oophorectomy, in late 20's  ? ARTERY BIOPSY Right 04/07/2016  ? Procedure: BIOPSY TEMPORAL ARTERY RIGHT;  Surgeon: [name], MD;  Location: [facility];  Service: General;  Laterality: Right;  ? bladder tack  2017  ? COLONOSCOPY WITH PROPOFOL N/A 06/04/2014  ? Procedure: COLONOSCOPY WITH PROPOFOL;  Surgeon: Garlan Fair, MD;  Location: [facility];  Service: Endoscopy;  Laterality: N/A;, colon polyps removed.  ? DIAGNOSTIC LAPAROSCOPY    ? - endometriosis, ovarian cyst, right oophorectomy  ? RADIOACTIVE SEED GUIDED EXCISIONAL BREAST BIOPSY Right 05/15/2019  ? Procedure: RADIOACTIVE SEED GUIDED EXCISIONAL RIGHT BREAST BIOPSY;  Surgeon: [name], MD;  Location: [facility];  Service: General;  Laterality: Right;  ? [place]    ?  TMJ ARTHROPLASTY    ? retained hardware  ? TONSILLECTOMY    ? ?Social History  ? ?Socioeconomic History  ? Marital status: Divorced  ?  Spouse name: Not on file  ? Number of children: Not on file  ? Years of education: Not on file  ? Highest education level: Not on file  ?Occupational History  ? Not on file  ?Tobacco Use  ? Smoking status: Every Day  ?  Packs/day: 0.50  ?  Years: 30.00  ?  Pack years: 15.00  ?  Types: Cigarettes  ?  Last attempt to quit: 03/23/2013  ?  Years since quitting: 8.8  ? Smokeless tobacco: Never  ? Tobacco comments:  ?  Quit  7'14,  04-06-16" occ. has a rare ciagarette with stress situations"  ?Substance and Sexual Activity  ? Alcohol use: Yes  ?  Comment: occ. social,rare  ? Drug use: No  ? Sexual activity: Yes  ?Other Topics Concern  ? Not on file  ?Social History Narrative  ? Caffeine- tea daily   ? ?Social Determinants of Health  ? ?Financial Resource Strain: Not on file  ?Food Insecurity: Not on file  ?Transportation Needs: Not on file  ?Physical Activity: Not on file  ?Stress: Not on file  ?Social Connections: Not on file  ?Intimate Partner Violence: Not on file  ? ?Current Outpatient Medications on File Prior to Visit  ?Medication Sig Dispense Refill  ? amitriptyline (ELAVIL) 25 MG tablet Take 25 mg by mouth at bedtime.    ? estradiol (VIVELLE-DOT) 0.1 MG/24HR patch Place 1 patch onto the skin 2 (two) times a week.    ? fenofibrate 160 MG tablet Take 1 tablet (160 mg total) by mouth daily. 30 tablet 0  ? insulin degludec (TRESIBA FLEXTOUCH) 200 UNIT/ML FlexTouch Pen Inject 70 Units into the skin daily. And pen needles 1/day 36 mL 3  ? LORazepam (ATIVAN) 1 MG tablet Take 1 mg by mouth daily as needed for anxiety.     ? omeprazole (PRILOSEC) 40 MG capsule Take 40 mg by mouth every morning.    ? pregabalin (LYRICA) 25 MG capsule Take 25 mg twice a day for one week, then increase to 25 in the morning and 50 mg at night for one week, then increase to 50 mg twice a day 120 capsule 3  ? rOPINIRole (REQUIP) 1 MG tablet Take 1 mg by mouth at bedtime.     ? telmisartan (MICARDIS) 40 MG tablet Take 40 mg by mouth daily.    ? zolpidem (AMBIEN) 5 MG tablet Take 5 mg by mouth at bedtime.     ? ?No current facility-administered medications on file prior to visit.  ? ?Allergies  ?Allergen Reactions  ? Paroxetine Hcl Other (See Comments)  ?  Suicidal thoughts  ? ?Family History  ?Problem Relation Age of Onset  ? Lupus Mother   ? Diabetes Father   ? Hypertension Father   ? Panic disorder Father   ? Breast cancer Maternal Uncle   ? ? ?PE: ?BP 110/82 (BP  Location: Right Arm, Patient Position: Sitting, Cuff Size: Normal)   Pulse 89   Ht '5\' 3"'$  (1.6 m)   Wt 161 lb 6.4 oz (73.2 kg)   SpO2 97%   BMI 28.59 kg/m?  ?Wt Readings from Last 3 Encounters:  ?01/27/22 161 lb 6.4 oz (73.2 kg)  ?12/02/21 159 lb (72.1 kg)  ?11/24/21 160 lb 3.2 oz (72.7 kg)  ? ?Constitutional: overweight, in NAD ?Eyes:  PERRLA, EOMI, no exophthalmos ?ENT: moist mucous membranes, no thyromegaly, no cervical lymphadenopathy ?Cardiovascular: RRR, No MRG ?Respiratory: CTA B ?Musculoskeletal: no deformities, strength intact in all 4 ?Skin: moist, warm, no rashes ?Neurological: no tremor with outstretched hands, DTR normal in all 4 ? ?ASSESSMENT: ?1. DM2, insulin-dependent, uncontrolled, without long-term complications, but with hyperglycemia ? ?2. HL ? ?PLAN:  ?1. Patient with long-standing, uncontrolled diabetes, on long-acting insulin only, with slight improvement in control at last visit and an HbA1c of 11.6% in 11/2021, gradually decreased from 14.5%.  However, at today's visit, HbA1c increased to 12.2%.  She is not checking sugars now.  Reviewing the sugars from 3 months ago, they were very high, in the 200s to 300s.  At that time, Tyler Aas was increased by 10 units. 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  We cannot metformin due to glucotoxicity but also due to previous intolerance to it.  SGLT2 inhibitor not indicated at this point but may be useful in the future.  GLP-1 receptor agonist are relatively contraindicated due to her history of pancreatitis.  At today's visit I suggested to add mealtime insulin.  We discussed about how to vary the dose based on the size and consistency of her meals.  Advised her to inject this 15 minutes before every meal and to increase the dose if the sugars remain elevated after meals ?-She is not checking blood sugars and strongly advised her to start.  At today's visit I suggested a  freestyle libre CGM.  This was sent to the pharmacy in the past for her but not covered.  As of now, since she is on 4 injections a day, she definitely qualifies for this and should be able to obtain it. ?- I suggested to:

## 2022-03-29 ENCOUNTER — Ambulatory Visit: Payer: BC Managed Care – PPO | Admitting: Internal Medicine

## 2022-03-29 NOTE — Progress Notes (Deleted)
Patient ID: Yatzari Jonsson, female   DOB: 1966/08/02, 56 y.o.   MRN: [id number]  HPI: Katiejo Gilroy is a 56 y.o.-year-old female, returning for follow-up for DM2, dx in 2019, insulin-dependent since 2020, uncontrolled, without long-term complications, but with hyperglycemia.  Last visit 2 months ago.  Reviewed HbA1c: Lab Results  Component Value Date   HGBA1C 12.2 (A) 01/27/2022   HGBA1C 11.6 (A) 11/24/2021   HGBA1C 13.0 (A) 06/11/2021   HGBA1C 13.3 (A) 12/26/2020   HGBA1C 14.5 (H) 05/09/2019   HGBA1C 7.3 (H) 05/30/2016   Pt is on a regimen of: - Tresiba 60 >> 70 units at bedtime - Novolog 8-14 units 15 min before meals (added 01/2022) Tried Metformin  IR and ER >> stopped due inefficacy but also GI symptoms.  Pt now checks blood sugars more than 4 times a day with her CGM:  Previously, in 10/2021: - am: 221, 265-343 - 2h after b'fast: n/c - before lunch: n/c - 2h after lunch: 286, 349 - before dinner: n/c - 2h after dinner: n/c - bedtime: n/c - nighttime: n/c Lowest sugar was upper 100s; she has hypoglycemia awareness at 70.  Highest sugar was 300s.  Glucometer:One Touch   - no CKD, last BUN/creatinine:  Lab Results  Component Value Date   BUN 11 10/29/2021   BUN 12 05/09/2019   CREATININE 0.52 10/29/2021   CREATININE 0.58 05/09/2019  On telmisartan 40 mg daily.  - + HL: last lipids available for review: Lab Results  Component Value Date   TRIG 938 (H) 06/01/2016  On fenofibrate 160 mg daily.  - last eye exam was in 2022. No DR reportedly.   - + numbness and tingling in her feet.  She is on Neurontin 300 mg 3 times a day and Elavil 25 mg at bedtime.  Last foot exam 05/2021.  She does have a history of HTN, pancreatitis in 2018 due to hypertriglyceridemia, restless leg syndrome, GERD, headaches, endometriosis.  ROS: No increased urination, blurry vision, nausea, chest pain.  She does have weight loss.  Past Medical History:  Diagnosis Date   Breast  mass    Axillary lump left side   Diabetes mellitus without complication (HCC)    Endometriosis    history of.   GERD (gastroesophageal reflux disease)    Headache    frequent , now less with Prednisone use of 4 weeks.   History of colon polyps    Hyperlipidemia    Hypertension    Insomnia    Pancreatitis 2017   PONV (postoperative nausea and vomiting)    Restless leg syndrome    Past Surgical History:  Procedure Laterality Date   ABDOMINAL HYSTERECTOMY     "fibroids, endometriosis"/left oophorectomy, in late 20's   ARTERY BIOPSY Right 04/07/2016   Procedure: BIOPSY TEMPORAL ARTERY RIGHT;  Surgeon: [name], MD;  Location: [facility];  Service: General;  Laterality: Right;   bladder tack  2017   COLONOSCOPY WITH PROPOFOL N/A 06/04/2014   Procedure: COLONOSCOPY WITH PROPOFOL;  Surgeon: Garlan Fair, MD;  Location: [facility];  Service: Endoscopy;  Laterality: N/A;, colon polyps removed.   DIAGNOSTIC LAPAROSCOPY     - endometriosis, ovarian cyst, right oophorectomy   RADIOACTIVE SEED GUIDED EXCISIONAL BREAST BIOPSY Right 05/15/2019   Procedure: RADIOACTIVE SEED GUIDED EXCISIONAL RIGHT BREAST BIOPSY;  Surgeon: [name], MD;  Location: [place];  Service: General;  Laterality: Right;   RECTOCELE REPAIR     TMJ ARTHROPLASTY  retained hardware   TONSILLECTOMY     Social History   Socioeconomic History   Marital status: Divorced    Spouse name: Not on file   Number of children: Not on file   Years of education: Not on file   Highest education level: Not on file  Occupational History   Not on file  Tobacco Use   Smoking status: Every Day    Packs/day: 0.50    Years: 30.00    Total pack years: 15.00    Types: Cigarettes    Last attempt to quit: 03/23/2013    Years since quitting: 9.0   Smokeless tobacco: Never   Tobacco comments:    Quit  7'14, 04-06-16" occ. has a rare ciagarette with stress situations"  Substance and Sexual Activity   Alcohol use: Yes     Comment: occ. social,rare   Drug use: No   Sexual activity: Yes  Other Topics Concern   Not on file  Social History Narrative   Caffeine- tea daily    Social Determinants of Health   Financial Resource Strain: Not on file  Food Insecurity: Not on file  Transportation Needs: Not on file  Physical Activity: Not on file  Stress: Not on file  Social Connections: Not on file  Intimate Partner Violence: Not on file   Current Outpatient Medications on File Prior to Visit  Medication Sig Dispense Refill   amitriptyline (ELAVIL) 25 MG tablet Take 25 mg by mouth at bedtime.     Blood Glucose Monitoring Suppl (ONETOUCH VERIO) w/Device KIT Use as instructed to check blood sugars 3 times daily 1 kit 0   Continuous Blood Gluc Sensor (FREESTYLE LIBRE 3 SENSOR) MISC 1 each by Does not apply route every 14 (fourteen) days. 6 each 3   estradiol (VIVELLE-DOT) 0.1 MG/24HR patch Place 1 patch onto the skin 2 (two) times a week.     fenofibrate 160 MG tablet Take 1 tablet (160 mg total) by mouth daily. 30 tablet 0   glucose blood (ONETOUCH VERIO) test strip Use as instructed to check blood sugars 3 times daily 200 each 3   insulin aspart (NOVOLOG FLEXPEN) 100 UNIT/ML FlexPen Inject 8-14 Units into the skin 3 (three) times daily with meals. 30 mL 3   insulin degludec (TRESIBA FLEXTOUCH) 200 UNIT/ML FlexTouch Pen Inject 70 Units into the skin daily. And pen needles 1/day 36 mL 3   Insulin Pen Needle 32G X 4 MM MISC Use 4x a day 300 each 3   LORazepam (ATIVAN) 1 MG tablet Take 1 mg by mouth daily as needed for anxiety.      omeprazole (PRILOSEC) 40 MG capsule Take 40 mg by mouth every morning.     OneTouch Delica Lancets 87G MISC Use as instructed to check blood sugars 3 times daily 100 each 2   pregabalin (LYRICA) 25 MG capsule Take 25 mg twice a day for one week, then increase to 25 in the morning and 50 mg at night for one week, then increase to 50 mg twice a day 120 capsule 3   rOPINIRole (REQUIP) 1 MG  tablet Take 1 mg by mouth at bedtime.      telmisartan (MICARDIS) 40 MG tablet Take 40 mg by mouth daily.     zolpidem (AMBIEN) 5 MG tablet Take 5 mg by mouth at bedtime.      No current facility-administered medications on file prior to visit.   Allergies  Allergen Reactions   Paroxetine Hcl Other (See  Comments)    Suicidal thoughts   Family History  Problem Relation Age of Onset   Lupus Mother    Diabetes Father    Hypertension Father    Panic disorder Father    Breast cancer Maternal Uncle     PE: There were no vitals taken for this visit. Wt Readings from Last 3 Encounters:  01/27/22 161 lb 6.4 oz (73.2 kg)  12/02/21 159 lb (72.1 kg)  11/24/21 160 lb 3.2 oz (72.7 kg)   Constitutional: overweight, in NAD Eyes: PERRLA, EOMI, no exophthalmos ENT: moist mucous membranes, no thyromegaly, no cervical lymphadenopathy Cardiovascular: RRR, No MRG Respiratory: CTA B Musculoskeletal: no deformities Skin: moist, warm, no rashes Neurological: no tremor with outstretched hands  ASSESSMENT: 1. DM2, insulin-dependent, uncontrolled, without long-term complications, but with hyperglycemia  2. HL  PLAN:  1. Patient with longstanding, uncontrolled, type 2 diabetes, on long-acting insulin only at last visit, to which we added mealtime insulin since an HbA1c was still high at 12.2%.  At that time she was not checking blood sugars and I suggested a CGM.  We did discuss about glucotoxicity and the fact the Tyler Aas was not enough to improve her blood sugars.  She agreed to the NovoLog at that time.  I advised her to take this 15 minutes before every meal and increase the dose if the sugars remained elevated after the meal. CGM interpretation: -At today's visit, we reviewed her CGM downloads: It appears that *** of values are in target range (goal >70%), while *** are higher than 180 (goal <25%), and *** are lower than 70 (goal <4%).  The calculated average blood sugar is ***.  The projected  HbA1c for the next 3 months (GMI) is ***. -Reviewing the CGM trends, ***  - I suggested to:  Patient Instructions  Please continue: - Tresiba 70 units daily - Novolog 8-14 units 15 min before meals  Start the CGM.  Please return in 3 months.  - we checked her HbA1c: 7%  - advised to check sugars at different times of the day - 4x a day, rotating check times - advised for yearly eye exams >> she is UTD - return to clinic in 3 months  2. HL - prev. High TG per records, complicated with pancreatitis -As of now, she is on fenofibrate 160 mg daily -She had lipids with her PCP at the beginning of the year but I do not have these records.  Per patient's report, these have been at goal.   Philemon Kingdom, MD PhD [facility] Endocrinology

## 2022-04-08 ENCOUNTER — Ambulatory Visit: Payer: BC Managed Care – PPO | Admitting: Psychiatry

## 2022-04-08 VITALS — BP 113/74 | HR 81 | Ht 63.5 in | Wt 163.5 lb

## 2022-04-08 DIAGNOSIS — M5481 Occipital neuralgia: Secondary | ICD-10-CM | POA: Diagnosis not present

## 2022-04-08 NOTE — Progress Notes (Signed)
   CC:  headaches  Follow-up Visit  Last visit: 12/02/21  Brief HPI: 56 year old female with a history of DM2 and gastroparesis who follows in clinic for occipital neuralgia and nerve pain.  At her last visit she was started on Lyrica for headache prevention and nerve pain.   Interval History: Headaches have improved with Lyrica 50 mg BID. She occasionally gets shooting pains but they are less frequent now.  Continues to have pain in her hands and feet though these have also improved with Lyrica. Fingers will have intermittent burning and will lock up when she holds things. She occasionally get muscle twitching in her bicep and hands. Toes will cramp up as well. Continues to have shooting pain down her right leg but it is less severe than it was before Lyrica.   Current Headache Regimen: Preventative: Lyrica 50 mg BID   Prior Therapies                                  Elavil 37.5 mg QHS - nausea Gabapentin 600 mg TID - drowsiness Lyrica 50 mg BID lorazepam  Physical Exam:   Vital Signs: BP 113/74   Pulse 81   Ht 5' 3.5" (1.613 m)   Wt 163 lb 8 oz (74.2 kg)   BMI 28.51 kg/m  GENERAL:  well appearing, in no acute distress, alert  SKIN:  Color, texture, turgor normal. No rashes or lesions HEAD:  Normocephalic/atraumatic. RESP: normal respiratory effort MSK:  No gross joint deformities.   NEUROLOGICAL: Mental Status: Alert, oriented to person, place and time, Follows commands, and Speech fluent and appropriate. Cranial Nerves: PERRL, face symmetric, no dysarthria, hearing grossly intact Motor: moves all extremities equally Gait: normal-based.  IMPRESSION: 56 year old female with a history of DM2 and gastroparesis who presents for follow up of occipital neuralgia and nerve pain. She has improved significantly with Lyrica though she continues to have intermittent nerve pain and headaches. Will increase Lyrica up to 75 mg BID. Supplement information for muscle cramps and  nerve pain provided.  PLAN: -Increase Lyrica to 50/75 x1 week, then to 75 mg BID -next steps: consider occipital nerve block, consider EMG if neuropathy/muscle twitching worsens   Follow-up: 6 months  I spent a total of 29 minutes on the date of the service. Headache education was done.  Discussed medication side effects, adverse reactions and drug interactions. Written educational materials and patient instructions outlining all of the above were given.  Genia Harold, MD 04/08/22 2:02 PM

## 2022-04-08 NOTE — Patient Instructions (Addendum)
Increase Lyrica (pregabalin) to 2 pills in the morning and 3 pills at bedtime for one week, then can increase to 3 pills twice a day  Magnesium oxide 500 mg daily can help with muscle cramps and twitches  Supplements that can be tried for "nerve" type pain: 1. Alpha lipoic acid '600mg'$  daily: Has some research data but actual dose not well established as they used IV in the clinical research trials. No major side effects other than <1% of people report upset stomach. This can be taken twice per day ('1200mg'$  daily) if no relief obtained.  2. Acetyl-L-carnitine '1000mg'$  3 times daily: this has the most research data backing it with reports of diabetic, HIV and chemotherapy related neuropathy patients reporting improved symptoms. Well tolerated overall but can cause GI upset so take it with food.  3. Fish Oil ('750mg'$  eicosapentaenoic acid, '560mg'$  docosapentaenoic acid, '1020mg'$  docosahexaenoic acid) - animal models suggest it can improve neuropathy and potentially stimulate nerve growth. It has also been shown to be beneficial in humans with type I diabetes and neuropathy.  4. Lidocaine cream - 1-2% can be applied to the feet/symptomatic areas several times daily. Wear gloves!  5. Capsaicin cream: Made of chili peppers, this cream burns and is actually rather painful when you first put it on. Mechanism of action is that it overwhelms all of the pain fibers, which theoretically lessens the pain. Wear gloves!  8. Curcumin - up to '600mg'$  three times daily - somewhat expensive, but can be found on [employer]. Most supplements offer ~'1800mg'$  just to be taken daily. Animal models suggest it may help with neve pain and may also help with weight loss. There are no large data human trials to support the use of this.  9. Vitamin E '10mg'$  twice daily  10. Vick's vapor rub

## 2022-04-12 ENCOUNTER — Other Ambulatory Visit: Payer: Self-pay | Admitting: Psychiatry

## 2022-04-29 ENCOUNTER — Telehealth: Payer: Self-pay | Admitting: Family Medicine

## 2022-04-29 NOTE — Telephone Encounter (Signed)
LVM informing pt of need to reschedule 1/22 appointment - Amy out

## 2022-06-01 DIAGNOSIS — M79651 Pain in right thigh: Secondary | ICD-10-CM | POA: Insufficient documentation

## 2022-06-01 DIAGNOSIS — M79604 Pain in right leg: Secondary | ICD-10-CM | POA: Diagnosis not present

## 2022-06-01 DIAGNOSIS — M25561 Pain in right knee: Secondary | ICD-10-CM | POA: Diagnosis not present

## 2022-06-01 DIAGNOSIS — M25562 Pain in left knee: Secondary | ICD-10-CM | POA: Diagnosis not present

## 2022-06-01 DIAGNOSIS — M25512 Pain in left shoulder: Secondary | ICD-10-CM | POA: Diagnosis not present

## 2022-06-16 ENCOUNTER — Encounter: Payer: Self-pay | Admitting: Internal Medicine

## 2022-06-16 ENCOUNTER — Ambulatory Visit: Payer: BC Managed Care – PPO | Admitting: Internal Medicine

## 2022-06-16 VITALS — BP 120/74 | HR 94 | Ht 63.5 in | Wt 161.2 lb

## 2022-06-16 DIAGNOSIS — E1142 Type 2 diabetes mellitus with diabetic polyneuropathy: Secondary | ICD-10-CM | POA: Diagnosis not present

## 2022-06-16 DIAGNOSIS — E1165 Type 2 diabetes mellitus with hyperglycemia: Secondary | ICD-10-CM | POA: Diagnosis not present

## 2022-06-16 DIAGNOSIS — E785 Hyperlipidemia, unspecified: Secondary | ICD-10-CM | POA: Diagnosis not present

## 2022-06-16 LAB — POCT GLYCOSYLATED HEMOGLOBIN (HGB A1C): Hemoglobin A1C: 11.8 % — AB (ref 4.0–5.6)

## 2022-06-16 NOTE — Patient Instructions (Addendum)
Please continue: - Tresiba 80 units daily  Restart: - Novolog 10-15 units 15 min before meals - can increase further if needed  Attach the CGM.  Please return in 3 months with your receiver.

## 2022-06-16 NOTE — Progress Notes (Signed)
Patient ID: Diamond [name], female   DOB: Nov 14, 1965, 56 y.o.   MRN: [id number]  HPI: Diamond [name] is a 56 y.o.-year-old female, returning for follow-up for DM2, dx in 2019, insulin-dependent since 2020, uncontrolled, without long-term complications, but with hyperglycemia.  Last visit 4.5 months ago.  Interim history: No increased urination, blurry vision, nausea, chest pain. She took Novolog intermittently since last visit - not taking it now.  Approximately 1 month ago she was off her treatment as she got discouraged by high blood sugars.  Reviewed HbA1c: Lab Results  Component Value Date   HGBA1C 12.2 (A) 01/27/2022   HGBA1C 11.6 (A) 11/24/2021   HGBA1C 13.0 (A) 06/11/2021   HGBA1C 13.3 (A) 12/26/2020   HGBA1C 14.5 (H) 05/09/2019   HGBA1C 7.3 (H) 05/30/2016   Pt is on a regimen of: - Tresiba 60 >> 70 >> 80 units daily (increased by herself since last visit) - Novolog 8-14 units 15 min before meals - added 01/2022 - not taking Tried Metformin  IR and ER >> stopped due inefficacy but also GI symptoms.  Patient was not checking sugars at last visit but she did have a CGM which I advised her to attach. She did not.  She was checking sugars 1-2 times a day now with her glucometer. - am: 221, 265-343 >> 200s-332 - 2h after b'fast: n/c - before lunch: n/c - 2h after lunch: 286, 349 >> 317-452 - before dinner: n/c - 2h after dinner: n/c - bedtime: n/c - nighttime: n/c Lowest sugar was 187; she has hypoglycemia awareness at 70.  Highest sugar was 300s >> 400s  Glucometer:One Touch   - no CKD, last BUN/creatinine:  Lab Results  Component Value Date   BUN 11 10/29/2021   BUN 12 05/09/2019   CREATININE 0.52 10/29/2021   CREATININE 0.58 05/09/2019  On telmisartan 40 mg daily.  - + HL: last lipids available for review: Lab Results  Component Value Date   TRIG 938 (H) 06/01/2016  On fenofibrate 160 mg daily.  - last eye exam was in 2022. No DR reportedly.   - +  numbness and tingling in her feet.  She is on Neurontin 300 mg 3 times a day and Elavil 25 mg at bedtime.  She does have a history of HTN, pancreatitis in 2018 due to hypertriglyceridemia, restless leg syndrome, GERD, headaches, endometriosis.  ROS: + see HPI  Past Medical History:  Diagnosis Date   Breast mass    Axillary lump left side   Diabetes mellitus without complication (HCC)    Endometriosis    history of.   GERD (gastroesophageal reflux disease)    Headache    frequent , now less with Prednisone use of 4 weeks.   History of colon polyps    Hyperlipidemia    Hypertension    Insomnia    Pancreatitis 2017   PONV (postoperative nausea and vomiting)    Restless leg syndrome    Past Surgical History:  Procedure Laterality Date   ABDOMINAL HYSTERECTOMY     "fibroids, endometriosis"/left oophorectomy, in late 20's   ARTERY BIOPSY Right 04/07/2016   Procedure: BIOPSY TEMPORAL ARTERY RIGHT;  Surgeon: [name], MD;  Location: [facility];  Service: General;  Laterality: Right;   bladder tack  2017   COLONOSCOPY WITH PROPOFOL N/A 06/04/2014   Procedure: COLONOSCOPY WITH PROPOFOL;  Surgeon: Garlan Fair, MD;  Location: [facility];  Service: Endoscopy;  Laterality: N/A;, colon polyps removed.   DIAGNOSTIC LAPAROSCOPY     -  endometriosis, ovarian cyst, right oophorectomy   RADIOACTIVE SEED GUIDED EXCISIONAL BREAST BIOPSY Right 05/15/2019   Procedure: RADIOACTIVE SEED GUIDED EXCISIONAL RIGHT BREAST BIOPSY;  Surgeon: [name], MD;  Location: [place];  Service: General;  Laterality: Right;   RECTOCELE REPAIR     TMJ ARTHROPLASTY     retained hardware   TONSILLECTOMY     Social History   Socioeconomic History   Marital status: Divorced    Spouse name: Not on file   Number of children: Not on file   Years of education: Not on file   Highest education level: Not on file  Occupational History   Not on file  Tobacco Use   Smoking status: Every Day    Packs/day: 0.50     Years: 30.00    Total pack years: 15.00    Types: Cigarettes    Last attempt to quit: 03/23/2013    Years since quitting: 9.2   Smokeless tobacco: Never   Tobacco comments:    Quit  7'14, 04-06-16" occ. has a rare ciagarette with stress situations"  Substance and Sexual Activity   Alcohol use: Yes    Comment: occ. social,rare   Drug use: No   Sexual activity: Yes  Other Topics Concern   Not on file  Social History Narrative   Caffeine- tea daily    Social Determinants of Health   Financial Resource Strain: Not on file  Food Insecurity: Not on file  Transportation Needs: Not on file  Physical Activity: Not on file  Stress: Not on file  Social Connections: Not on file  Intimate Partner Violence: Not on file   Current Outpatient Medications on File Prior to Visit  Medication Sig Dispense Refill   amitriptyline (ELAVIL) 25 MG tablet Take 25 mg by mouth at bedtime.     Blood Glucose Monitoring Suppl (ONETOUCH VERIO) w/Device KIT Use as instructed to check blood sugars 3 times daily 1 kit 0   Continuous Blood Gluc Sensor (FREESTYLE LIBRE 3 SENSOR) MISC 1 each by Does not apply route every 14 (fourteen) days. 6 each 3   estradiol (VIVELLE-DOT) 0.1 MG/24HR patch Place 1 patch onto the skin 2 (two) times a week.     fenofibrate 160 MG tablet Take 1 tablet (160 mg total) by mouth daily. 30 tablet 0   glucose blood (ONETOUCH VERIO) test strip Use as instructed to check blood sugars 3 times daily 200 each 3   insulin aspart (NOVOLOG FLEXPEN) 100 UNIT/ML FlexPen Inject 8-14 Units into the skin 3 (three) times daily with meals. 30 mL 3   insulin degludec (TRESIBA FLEXTOUCH) 200 UNIT/ML FlexTouch Pen Inject 70 Units into the skin daily. And pen needles 1/day 36 mL 3   Insulin Pen Needle 32G X 4 MM MISC Use 4x a day 300 each 3   LORazepam (ATIVAN) 1 MG tablet Take 1 mg by mouth daily as needed for anxiety.      omeprazole (PRILOSEC) 40 MG capsule Take 40 mg by mouth every morning.      OneTouch Delica Lancets 37S MISC Use as instructed to check blood sugars 3 times daily 100 each 2   pregabalin (LYRICA) 25 MG capsule TAKE AS INSTRUCTED BY YOUR PRESCRIBER 120 capsule 3   rOPINIRole (REQUIP) 1 MG tablet Take 1 mg by mouth at bedtime.      telmisartan (MICARDIS) 40 MG tablet Take 40 mg by mouth daily.     zolpidem (AMBIEN) 5 MG tablet Take 5 mg by mouth  at bedtime.      No current facility-administered medications on file prior to visit.   Allergies  Allergen Reactions   Paroxetine Hcl Other (See Comments)    Suicidal thoughts   Family History  Problem Relation Age of Onset   Lupus Mother    Diabetes Father    Hypertension Father    Panic disorder Father    Breast cancer Maternal Uncle     PE: BP 120/74 (BP Location: Right Arm, Patient Position: Sitting, Cuff Size: Normal)   Pulse 94   Ht 5' 3.5" (1.613 m)   Wt 161 lb 3.2 oz (73.1 kg)   SpO2 98%   BMI 28.11 kg/m  Wt Readings from Last 3 Encounters:  06/16/22 161 lb 3.2 oz (73.1 kg)  04/08/22 163 lb 8 oz (74.2 kg)  01/27/22 161 lb 6.4 oz (73.2 kg)   Constitutional: overweight, in NAD Eyes:  EOMI, no exophthalmos ENT: no neck masses, no cervical lymphadenopathy Cardiovascular: RRR, No MRG Respiratory: CTA B Musculoskeletal: no deformities Skin:no rashes Neurological: no tremor with outstretched hands Diabetic Foot Exam - Simple   Simple Foot Form Diabetic Foot exam was performed with the following findings: Yes 06/16/2022  9:04 AM  Visual Inspection No deformities, no ulcerations, no other skin breakdown bilaterally: Yes Sensation Testing Intact to touch and monofilament testing bilaterally: Yes Pulse Check Posterior Tibialis and Dorsalis pulse intact bilaterally: Yes Comments    ASSESSMENT: 1. DM2, insulin-dependent, uncontrolled, without long-term complications, but with hyperglycemia  2. HL  PLAN:  1. Patient with longstanding, uncontrolled, diabetes, on basal/bolus insulin regimen, with  NovoLog added to [place] at last visit.  At that time, Tyler Aas was not enough to improve her blood sugars.  We could not use metformin due to glucotoxicity but also due to previous intolerance to it.  SGLT2 inhibitors were not first option due to very elevated HbA1c, 12.2% at that time.  And GLP-1 receptor agonist were relatively contraindicated due to her history of pancreatitis.  She is now checking blood sugars and I advised her to start. -At today's visit, she did not attach the CGM and she is not taking the NovoLog.  She increased her Tyler Aas by herself since last visit but this did not help.  Sugars remain high throughout the day, in the 200s to 400s range.  At this visit, we discussed about possible complications of having such uncontrolled diabetes for such a long time.  Discussed about the risk of stroke and other complications.  I would not encourage her to continue with her current practice.  I strongly advised her to add back NovoLog and also to attach the CGM.  She is afraid of getting many alarms due to the high blood sugars but I advised her that she can shut down the alarms if she prefers. - I suggested to:  Patient Instructions  Please continue: - Tresiba 80 units daily  Restart: - Novolog 10-15 units 15 min before meals - can increase further if needed  Attach the CGM.  Please return in 3 months with your receiver.   - we checked her HbA1c: 11.8% (slightly lower) - advised to check sugars at different times of the day - 4x a day, rotating check times - advised for yearly eye exams >> she is UTD - return to clinic in 3 months  2. HL -Previously hypertriglyceridemia per review of records, leading to pancreatitis -Latest lipid panel reviewed from 10/08/2021: 150/204/40/75 -triglycerides not in pancreatitis range, only slightly high -She  continues on fenofibrate 160 mg daily without side effects  Philemon Kingdom, MD PhD [facility] Endocrinology

## 2022-06-30 DIAGNOSIS — M79604 Pain in right leg: Secondary | ICD-10-CM | POA: Diagnosis not present

## 2022-06-30 DIAGNOSIS — M25512 Pain in left shoulder: Secondary | ICD-10-CM | POA: Diagnosis not present

## 2022-07-02 DIAGNOSIS — M25512 Pain in left shoulder: Secondary | ICD-10-CM | POA: Diagnosis not present

## 2022-08-24 ENCOUNTER — Other Ambulatory Visit: Payer: Self-pay | Admitting: Psychiatry

## 2022-08-24 MED ORDER — PREGABALIN 75 MG PO CAPS: ORAL_CAPSULE | ORAL | 0 refills | Status: DC

## 2022-08-25 ENCOUNTER — Telehealth: Payer: Self-pay | Admitting: Psychiatry

## 2022-08-25 ENCOUNTER — Other Ambulatory Visit: Payer: Self-pay | Admitting: Psychiatry

## 2022-08-25 MED ORDER — PREGABALIN 50 MG PO CAPS: 50.0000 mg | ORAL_CAPSULE | Freq: Two times a day (BID) | ORAL | 1 refills | Status: DC

## 2022-08-25 NOTE — Telephone Encounter (Signed)
Called the patient back. Discussed that in last ov it was stated that she would increase to 75 mg BID. Pt states that she tried doing that and didn't tolerate the increase so she went back down to the 50 mg BID. She has been taking 2 25 mg twice a day.  Advised I would inform Dr Billey Gosling of this and we will resend for that dose for her and have it documented for future reference. Advised that we will cancel the current script that was sent. Pt verbalized understanding. Pt had no questions at this time but was encouraged to call back if questions arise.

## 2022-08-25 NOTE — Telephone Encounter (Signed)
Pt is calling. Stated she takes medication pregabalin (LYRICA) 75 MG capsule  and not pregabalin (LYRICA) 75 MG capsule. Stated she takes pregabalin (LYRICA) '25mg'$  two in morning and two at night. Pt is requesting a 90 day supply of pregabalin (LYRICA) '25mg'$ . Pt said she called the pharmacy and was told the pregabalin '25mg'$  was denied. Pt is requesting a refill be sent to [id number]

## 2022-09-17 ENCOUNTER — Encounter: Payer: Self-pay | Admitting: Internal Medicine

## 2022-09-17 ENCOUNTER — Ambulatory Visit: Payer: BC Managed Care – PPO | Admitting: Internal Medicine

## 2022-09-17 VITALS — BP 110/68 | HR 69 | Ht 63.5 in | Wt 172.4 lb

## 2022-09-17 DIAGNOSIS — E1165 Type 2 diabetes mellitus with hyperglycemia: Secondary | ICD-10-CM | POA: Diagnosis not present

## 2022-09-17 DIAGNOSIS — E785 Hyperlipidemia, unspecified: Secondary | ICD-10-CM

## 2022-09-17 DIAGNOSIS — E1142 Type 2 diabetes mellitus with diabetic polyneuropathy: Secondary | ICD-10-CM

## 2022-09-17 LAB — POCT GLYCOSYLATED HEMOGLOBIN (HGB A1C): Hemoglobin A1C: 9.4 % — AB (ref 4.0–5.6)

## 2022-09-17 MED ORDER — HUMULIN R U-500 KWIKPEN 500 UNIT/ML ~~LOC~~ SOPN: PEN_INJECTOR | SUBCUTANEOUS | 3 refills | Status: DC

## 2022-09-17 NOTE — Patient Instructions (Addendum)
Please stop [place] and NovoLog and start U500 insulin 30 min before meals: - before b'fast:  60-70 units - before lunch: 40-50 units - before dinner: 40-50 units  In the meantime: - Tresiba 90 units daily - Novolog 25-30 units before meals  Please return in 1.5 months.

## 2022-09-17 NOTE — Progress Notes (Signed)
Patient ID: Diamond [name], female   DOB: 11-Jan-1966, 56 y.o.   MRN: [id number]  HPI: Diamond [name] is a 56 y.o.-year-old female, returning for follow-up for DM2, dx in 2019, insulin-dependent since 2020, uncontrolled, without long-term complications, but with hyperglycemia.  Last visit 3 months ago.  Interim history: No increased urination, blurry vision, nausea, chest pain. At last visit, she was off NovoLog as she was discouraged by the high blood sugars.  We restarted this.   Reviewed HbA1c: Lab Results  Component Value Date   HGBA1C 11.8 (A) 06/16/2022   HGBA1C 12.2 (A) 01/27/2022   HGBA1C 11.6 (A) 11/24/2021   HGBA1C 13.0 (A) 06/11/2021   HGBA1C 13.3 (A) 12/26/2020   HGBA1C 14.5 (H) 05/09/2019   HGBA1C 7.3 (H) 05/30/2016   Pt is on a regimen of: - Tresiba 60 >> 70 >> 80 units daily  - Novolog 8-14 units 15 min before meals - added 01/2022 - not taking >> restarted: Novolog 10-15 >> 20 units 15 min before meals Tried Metformin  IR and ER >> stopped due inefficacy but also GI symptoms.  Patient was not checking sugars at last visit but she did have a CGM which I advised her to attach. She did not.  She was checking sugars >4x a day:  Prev.: - am: 221, 265-343 >> 200s-332 - 2h after b'fast: n/c - before lunch: n/c - 2h after lunch: 286, 349 >> 317-452 - before dinner: n/c - 2h after dinner: n/c - bedtime: n/c - nighttime: n/c Lowest sugar was 187 >> 160; she has hypoglycemia awareness at 70.  Highest sugar was 300s >> 400s >> HI  Glucometer:One Touch   Meals: - Breakfast: eggs + sausage - Lunch: sandwich, tuna + crackers - Dinner: salad or subs - Snacks:"I am a snacker" veggies, chips, no sweet drinks  - no CKD, last BUN/creatinine:  Lab Results  Component Value Date   BUN 11 10/29/2021   BUN 12 05/09/2019   CREATININE 0.52 10/29/2021   CREATININE 0.58 05/09/2019  On telmisartan 40 mg daily.  - + HL: last lipids available for review: 10/08/2021:  150/204/40/75  Lab Results  Component Value Date   TRIG 938 (H) 06/01/2016  On fenofibrate 160 mg daily.  - last eye exam was in 2022. No DR reportedly.   - + numbness and tingling in her feet.  On Lyrica and Elavil per PCP.  Last foot exam 06/16/2022.  She does have a history of HTN, pancreatitis in 2018 due to hypertriglyceridemia, restless leg syndrome, GERD, headaches, endometriosis.  ROS: + see HPI  Past Medical History:  Diagnosis Date   Breast mass    Axillary lump left side   Diabetes mellitus without complication (HCC)    Endometriosis    history of.   GERD (gastroesophageal reflux disease)    Headache    frequent , now less with Prednisone use of 4 weeks.   History of colon polyps    Hyperlipidemia    Hypertension    Insomnia    Pancreatitis 2017   PONV (postoperative nausea and vomiting)    Restless leg syndrome    Past Surgical History:  Procedure Laterality Date   ABDOMINAL HYSTERECTOMY     "fibroids, endometriosis"/left oophorectomy, in late 20's   ARTERY BIOPSY Right 04/07/2016   Procedure: BIOPSY TEMPORAL ARTERY RIGHT;  Surgeon: [name], MD;  Location: [facility];  Service: General;  Laterality: Right;   bladder tack  2017   COLONOSCOPY WITH PROPOFOL N/A  06/04/2014   Procedure: COLONOSCOPY WITH PROPOFOL;  Surgeon: Garlan Fair, MD;  Location: [facility];  Service: Endoscopy;  Laterality: N/A;, colon polyps removed.   DIAGNOSTIC LAPAROSCOPY     - endometriosis, ovarian cyst, right oophorectomy   RADIOACTIVE SEED GUIDED EXCISIONAL BREAST BIOPSY Right 05/15/2019   Procedure: RADIOACTIVE SEED GUIDED EXCISIONAL RIGHT BREAST BIOPSY;  Surgeon: [name], MD;  Location: [facility];  Service: General;  Laterality: Right;   RECTOCELE REPAIR     TMJ ARTHROPLASTY     retained hardware   TONSILLECTOMY     Social History   Socioeconomic History   Marital status: Divorced    Spouse name: Not on file   Number of children: Not on file   Years of education:  Not on file   Highest education level: Not on file  Occupational History   Not on file  Tobacco Use   Smoking status: Every Day    Packs/day: 0.50    Years: 30.00    Total pack years: 15.00    Types: Cigarettes    Last attempt to quit: 03/23/2013    Years since quitting: 9.4   Smokeless tobacco: Never   Tobacco comments:    Quit  7'14, 04-06-16" occ. has a rare ciagarette with stress situations"  Substance and Sexual Activity   Alcohol use: Yes    Comment: occ. social,rare   Drug use: No   Sexual activity: Yes  Other Topics Concern   Not on file  Social History Narrative   Caffeine- tea daily    Social Determinants of Health   Financial Resource Strain: Not on file  Food Insecurity: Not on file  Transportation Needs: Not on file  Physical Activity: Not on file  Stress: Not on file  Social Connections: Not on file  Intimate Partner Violence: Not on file   Current Outpatient Medications on File Prior to Visit  Medication Sig Dispense Refill   amitriptyline (ELAVIL) 25 MG tablet Take 25 mg by mouth at bedtime.     Blood Glucose Monitoring Suppl (ONETOUCH VERIO) w/Device KIT Use as instructed to check blood sugars 3 times daily 1 kit 0   Continuous Blood Gluc Sensor (FREESTYLE LIBRE 3 SENSOR) MISC 1 each by Does not apply route every 14 (fourteen) days. 6 each 3   estradiol (VIVELLE-DOT) 0.1 MG/24HR patch Place 1 patch onto the skin 2 (two) times a week.     fenofibrate 160 MG tablet Take 1 tablet (160 mg total) by mouth daily. 30 tablet 0   glucose blood (ONETOUCH VERIO) test strip Use as instructed to check blood sugars 3 times daily 200 each 3   insulin aspart (NOVOLOG FLEXPEN) 100 UNIT/ML FlexPen Inject 8-14 Units into the skin 3 (three) times daily with meals. 30 mL 3   insulin degludec (TRESIBA FLEXTOUCH) 200 UNIT/ML FlexTouch Pen Inject 70 Units into the skin daily. And pen needles 1/day 36 mL 3   Insulin Pen Needle 32G X 4 MM MISC Use 4x a day 300 each 3   LORazepam  (ATIVAN) 1 MG tablet Take 1 mg by mouth daily as needed for anxiety.      omeprazole (PRILOSEC) 40 MG capsule Take 40 mg by mouth every morning.     OneTouch Delica Lancets 83M MISC Use as instructed to check blood sugars 3 times daily 100 each 2   pregabalin (LYRICA) 50 MG capsule Take 1 capsule (50 mg total) by mouth 2 (two) times daily. 180 capsule 1   rOPINIRole (  REQUIP) 1 MG tablet Take 1 mg by mouth at bedtime.      telmisartan (MICARDIS) 40 MG tablet Take 40 mg by mouth daily.     zolpidem (AMBIEN) 5 MG tablet Take 5 mg by mouth at bedtime.      No current facility-administered medications on file prior to visit.   Allergies  Allergen Reactions   Paroxetine Hcl Other (See Comments)    Suicidal thoughts   Family History  Problem Relation Age of Onset   Lupus Mother    Diabetes Father    Hypertension Father    Panic disorder Father    Breast cancer Maternal Uncle    PE: BP 110/68 (BP Location: Right Arm, Patient Position: Sitting, Cuff Size: Normal)   Pulse 69   Ht 5' 3.5" (1.613 m)   Wt 172 lb 6.4 oz (78.2 kg)   SpO2 99%   BMI 30.06 kg/m  Wt Readings from Last 3 Encounters:  09/17/22 172 lb 6.4 oz (78.2 kg)  06/16/22 161 lb 3.2 oz (73.1 kg)  04/08/22 163 lb 8 oz (74.2 kg)   Constitutional: overweight, in NAD Eyes:  EOMI, no exophthalmos ENT: no neck masses, no cervical lymphadenopathy Cardiovascular: RRR, No MRG Respiratory: CTA B Musculoskeletal: no deformities Skin:no rashes Neurological: no tremor with outstretched hands  ASSESSMENT: 1. DM2, insulin-dependent, uncontrolled, without long-term complications, but with hyperglycemia  2. HL  PLAN:  1. Patient with longstanding, uncontrolled, type 2 diabetes, on basal-bolus insulin regimen, with still poor control.  At last visit, HbA1c was only slightly better, at 11.8%, decreased from 12.2%.  At last visit, she did not attach the CGM and she was not taking the NovoLog.  She increased her [place] by herself but  this did not help and sugars were still in the 200-100 range.  We discussed about possible complications of having such uncontrolled diabetes for a long time including increasing the risk of stroke, heart attacks, and other complications.  I advised her to add back NovoLog and also attached a CGM.  She was afraid of getting many alarms about the high blood sugars and we discussed about changing the alarm settings. -At this visit, she is using the freestyle libre 3 CGM, for which she is paying out-of-pocket.  Her insurance only pays for the freestyle libre 2 CGM, which she does not want as she would not want to scan the device. CGM interpretation: -At today's visit, we reviewed her CGM downloads: It appears that 1% of values are in target range (goal >70%), while 99% are higher than 180 (goal <25%), of which 90% are higher than 250, and 0% are lower than 70 (goal <4%).  The calculated average blood sugar is 317.  The projected HbA1c for the next 3 months (GMI) is 10.9%. -Reviewing the CGM trends, it appears that her sugars worsened in the last month compared to previously.  There was an initial improvement in her blood sugar trends per review of CGM downloads going back to our last visit.  In the last 2 weeks, sugars appear to be uniformly elevated, almost entirely above target, without clear trends.  Upon questioning, she snacks throughout the day and also eats high fat breakfast, without carbs, and I feel that this is perpetuating her insulin resistance.  We discussed about improving diet by snacking less and also reducing fatty foods.  I also recommended against white bread.  She is also eating popcorn when she does not cover with insulin.  I did recommend a  referral to nutrition, but she declined for now as she is a picky eater. -We discussed about the possibility of her insulin being degraded or expired.  She does not feel that this is the case.  We also discussed about scar tissue at the injection sites.   I examined her abdomen (she does rotate the sites) and I do not see or feel any scar tissue.  We did discuss about injecting more on the lateral sides of the abdomen towards the back but she cannot use the leg -tried this in the past and developed numbness in the leg.  She also cannot use the arms due to shoulder problems and not being able to lift her arm to inject. -In this case, I did recommend to change to U-500 insulin.  I explained how this works and the fact that it is injected 30 minutes before a meal.  I recommended to increase NovoLog and Tresiba until she is able to get the U-500 insulin and then stacked them away in the fridge as we may be able to use them later.  I also strongly recommended a change in diet.  -I advised her to adjust the dose of U-500 based on her blood sugars - I suggested to:  Patient Instructions  Please stop [place] and NovoLog and start U500 insulin 30 min before meals: - before b'fast:  60-70 units - before lunch: 40-50 units - before dinner: 40-50 units  In the meantime: - Tresiba 90 units daily - Novolog 25-30 units before meals  Please return in 1.5 months.  - we checked her HbA1c: 9.4% - lower - advised to check sugars at different times of the day - 4x a day, rotating check times - advised for yearly eye exams >> she is due - return to clinic in 1.5 months  2. HL -She has a history of hypertriglyceridemia per review of records, leading to pancreatitis -I reviewed her lipid panel from 10/08/2021: Triglycerides not in the pancreatitis range, only slightly high: 150/204/40/75  -She continues on fenofibrate 160 mg daily without side effects -Will check the lipid panel at next visit  Philemon Kingdom, MD PhD [facility]

## 2022-10-07 ENCOUNTER — Other Ambulatory Visit: Payer: Self-pay | Admitting: Internal Medicine

## 2022-10-07 DIAGNOSIS — Z1231 Encounter for screening mammogram for malignant neoplasm of breast: Secondary | ICD-10-CM

## 2022-10-11 ENCOUNTER — Ambulatory Visit: Payer: BC Managed Care – PPO | Admitting: Family Medicine

## 2022-10-19 ENCOUNTER — Ambulatory Visit: Payer: BC Managed Care – PPO | Admitting: Family Medicine

## 2022-10-19 ENCOUNTER — Encounter: Payer: Self-pay | Admitting: Family Medicine

## 2022-10-19 VITALS — BP 124/80 | HR 84 | Ht 63.5 in | Wt 181.6 lb

## 2022-10-19 DIAGNOSIS — M5481 Occipital neuralgia: Secondary | ICD-10-CM | POA: Diagnosis not present

## 2022-10-19 NOTE — Progress Notes (Signed)
Chief Complaint  Patient presents with   Follow-up    RM 1, alone. Last seen 04/08/22.  Pt reports sx worsened after last appt but have improved some. Still having nerve pain. Starts in groin, radiating down to feet. R thigh was the worst, "felt like a tooth ache". Saw Ortho who completed MRI from hip to knee. Rechecked back. Could not find anything to cause sx. Slowly got better over time.     HISTORY OF PRESENT ILLNESS:  10/19/22 ALL:  Diamond [name] is a 57 y.o. female here today for follow up for bilateral occipital neuralgia and nerve pain. She continues Lyrica '50mg'$  BID. She feels that initially the pain worsened but most recently pain has seemed to be fairly stable. She does continue to have numbness of scalp and face on occasion. She has random nerve pains on groin, hands, legs and feet. She is followed by orthopedics, endo and PCP regularly. Last A1C 122023 was 9.4, down from 11.8 in 05/2022. Glucose readings have improved since change of meds. Amitriptyline '25mg'$  at bedtime prescribed by PCP for gastroparesis. Ropinirole used for RLS.   Last refill of pregabalin was for 90 days on 08/25/2022.   HISTORY (copied from Dr Georgina Peer previous note)  Brief HPI: 57 year old female with a history of DM2 and gastroparesis who follows in clinic for occipital neuralgia and nerve pain.   At her last visit she was started on Lyrica for headache prevention and nerve pain.    Interval History: Headaches have improved with Lyrica 50 mg BID. She occasionally gets shooting pains but they are less frequent now.   Continues to have pain in her hands and feet though these have also improved with Lyrica. Fingers will have intermittent burning and will lock up when she holds things. She occasionally get muscle twitching in her bicep and hands. Toes will cramp up as well. Continues to have shooting pain down her right leg but it is less severe than it was before Lyrica.   Current Headache  Regimen: Preventative: Lyrica 50 mg BID   Prior Therapies                                  Elavil 37.5 mg QHS - nausea Gabapentin 600 mg TID - drowsiness Lyrica 50 mg BID lorazepam   REVIEW OF SYSTEMS: Out of a complete 14 system review of symptoms, the patient complains only of the following symptoms, right thigh pain, low back pain, numbness, sharp pains throughout body and all other reviewed systems are negative.   ALLERGIES: Allergies  Allergen Reactions   Paroxetine Hcl Other (See Comments)    Suicidal thoughts     HOME MEDICATIONS: Outpatient Medications Prior to Visit  Medication Sig Dispense Refill   amitriptyline (ELAVIL) 25 MG tablet Take 25 mg by mouth at bedtime.     Blood Glucose Monitoring Suppl (ONETOUCH VERIO) w/Device KIT Use as instructed to check blood sugars 3 times daily 1 kit 0   celecoxib (CELEBREX) 200 MG capsule Take 200 mg by mouth daily.     Continuous Blood Gluc Sensor (FREESTYLE LIBRE 3 SENSOR) MISC 1 each by Does not apply route every 14 (fourteen) days. 6 each 3   estradiol (VIVELLE-DOT) 0.1 MG/24HR patch Place 1 patch onto the skin 2 (two) times a week.     fenofibrate 160 MG tablet Take 1 tablet (160 mg total) by mouth daily. [place]  tablet 0   glucose blood (ONETOUCH VERIO) test strip Use as instructed to check blood sugars 3 times daily 200 each 3   insulin aspart (NOVOLOG FLEXPEN) 100 UNIT/ML FlexPen Inject 8-14 Units into the skin 3 (three) times daily with meals. 30 mL 3   insulin degludec (TRESIBA FLEXTOUCH) 200 UNIT/ML FlexTouch Pen Inject 70 Units into the skin daily. And pen needles 1/day 36 mL 3   Insulin Pen Needle 32G X 4 MM MISC Use 4x a day 300 each 3   insulin regular human CONCENTRATED (HUMULIN R U-500 KWIKPEN) 500 UNIT/ML KwikPen Use up to 200 units a day as advised 12 mL 3   LORazepam (ATIVAN) 1 MG tablet Take 1 mg by mouth daily as needed for anxiety.      omeprazole (PRILOSEC) 40 MG capsule Take 40 mg by mouth every morning.      OneTouch Delica Lancets 71I MISC Use as instructed to check blood sugars 3 times daily 100 each 2   pregabalin (LYRICA) 50 MG capsule Take 1 capsule (50 mg total) by mouth 2 (two) times daily. 180 capsule 1   rOPINIRole (REQUIP) 1 MG tablet Take 1 mg by mouth at bedtime.      telmisartan (MICARDIS) 40 MG tablet Take 40 mg by mouth daily.     zolpidem (AMBIEN) 5 MG tablet Take 5 mg by mouth at bedtime.      No facility-administered medications prior to visit.     PAST MEDICAL HISTORY: Past Medical History:  Diagnosis Date   Breast mass    Axillary lump left side   Diabetes mellitus without complication (HCC)    Endometriosis    history of.   GERD (gastroesophageal reflux disease)    Headache    frequent , now less with Prednisone use of 4 weeks.   History of colon polyps    Hyperlipidemia    Hypertension    Insomnia    Pancreatitis 2017   PONV (postoperative nausea and vomiting)    Restless leg syndrome      PAST SURGICAL HISTORY: Past Surgical History:  Procedure Laterality Date   ABDOMINAL HYSTERECTOMY     "fibroids, endometriosis"/left oophorectomy, in late 20's   ARTERY BIOPSY Right 04/07/2016   Procedure: BIOPSY TEMPORAL ARTERY RIGHT;  Surgeon: [name], MD;  Location: [facility];  Service: General;  Laterality: Right;   bladder tack  2017   COLONOSCOPY WITH PROPOFOL N/A 06/04/2014   Procedure: COLONOSCOPY WITH PROPOFOL;  Surgeon: Garlan Fair, MD;  Location: [facility];  Service: Endoscopy;  Laterality: N/A;, colon polyps removed.   DIAGNOSTIC LAPAROSCOPY     - endometriosis, ovarian cyst, right oophorectomy   RADIOACTIVE SEED GUIDED EXCISIONAL BREAST BIOPSY Right 05/15/2019   Procedure: RADIOACTIVE SEED GUIDED EXCISIONAL RIGHT BREAST BIOPSY;  Surgeon: [name], MD;  Location: [facility];  Service: General;  Laterality: Right;   RECTOCELE REPAIR     TMJ ARTHROPLASTY     retained hardware   TONSILLECTOMY       FAMILY HISTORY: Family History  Problem  Relation Age of Onset   Lupus Mother    Diabetes Father    Hypertension Father    Panic disorder Father    Breast cancer Maternal Uncle      SOCIAL HISTORY: Social History   Socioeconomic History   Marital status: Divorced    Spouse name: Not on file   Number of children: Not on file   Years of education: Not on file   Highest  education level: Not on file  Occupational History   Not on file  Tobacco Use   Smoking status: Every Day    Packs/day: 0.50    Years: 30.00    Total pack years: 15.00    Types: Cigarettes    Last attempt to quit: 03/23/2013    Years since quitting: 9.5   Smokeless tobacco: Never   Tobacco comments:    Quit  7'14, 04-06-16" occ. has a rare ciagarette with stress situations"  Substance and Sexual Activity   Alcohol use: Yes    Comment: occ. social,rare   Drug use: No   Sexual activity: Yes  Other Topics Concern   Not on file  Social History Narrative   Caffeine- tea daily    Social Determinants of Health   Financial Resource Strain: Not on file  Food Insecurity: Not on file  Transportation Needs: Not on file  Physical Activity: Not on file  Stress: Not on file  Social Connections: Not on file  Intimate Partner Violence: Not on file     PHYSICAL EXAM  Vitals:   10/19/22 1339  BP: 124/80  Pulse: 84  SpO2: 98%  Weight: 181 lb 9.6 oz (82.4 kg)  Height: 5' 3.5" (1.613 m)   Body mass index is 31.66 kg/m.  Generalized: Well developed, in no acute distress  Cardiology: normal rate and rhythm, no murmur auscultated  Respiratory: clear to auscultation bilaterally    Neurological examination  Mentation: Alert oriented to time, place, history taking. Follows all commands speech and language fluent Cranial nerve II-XII: Pupils were equal round reactive to light. Extraocular movements were full, visual field were full on confrontational test. Facial sensation and strength were normal. Uvula tongue midline. Head turning and shoulder shrug   were normal and symmetric. Motor: The motor testing reveals 5 over 5 strength of all 4 extremities. Good symmetric motor tone is noted throughout.  Sensory: Sensory testing is intact to soft touch on all 4 extremities. No evidence of extinction is noted.  Coordination: Cerebellar testing reveals good finger-nose-finger and heel-to-shin bilaterally.  Gait and station: Gait is normal. Tandem gait is normal. Romberg is negative. No drift is seen.  Reflexes: Deep tendon reflexes are symmetric and normal bilaterally.    DIAGNOSTIC DATA (LABS, IMAGING, TESTING) - I reviewed patient records, labs, notes, testing and imaging myself where available.  Lab Results  Component Value Date   WBC 9.8 10/29/2021   HGB 16.3 (H) 10/29/2021   HCT 48.7 (H) 10/29/2021   MCV 84.7 10/29/2021   PLT 250 10/29/2021      Component Value Date/Time   NA 135 10/29/2021 1252   K 4.0 10/29/2021 1252   CL 103 10/29/2021 1252   CO2 24 10/29/2021 1252   GLUCOSE 325 (H) 10/29/2021 1252   BUN 11 10/29/2021 1252   CREATININE 0.52 10/29/2021 1252   CALCIUM 9.3 10/29/2021 1252   PROT 6.8 06/03/2016 0445   ALBUMIN 3.2 (L) 06/03/2016 0445   AST 24 06/03/2016 0445   ALT 22 06/03/2016 0445   ALKPHOS 42 06/03/2016 0445   BILITOT 0.8 06/03/2016 0445   GFRNONAA >60 10/29/2021 1252   GFRAA >60 05/09/2019 0846   Lab Results  Component Value Date   TRIG 938 (H) 06/01/2016   Lab Results  Component Value Date   HGBA1C 9.4 (A) 09/17/2022   No results found for: "VITAMINB12" No results found for: "TSH"      No data to display  No data to display           ASSESSMENT AND PLAN  57 y.o. year old female  has a past medical history of Breast mass, Diabetes mellitus without complication ([place]), Endometriosis, GERD (gastroesophageal reflux disease), Headache, History of colon polyps, Hyperlipidemia, Hypertension, Insomnia, Pancreatitis (2017), PONV (postoperative nausea and vomiting), and Restless  leg syndrome. here with    Bilateral occipital neuralgia  Diamond [name] is doing well. She reports occipital neuralgia and nerve pain is stable on pregabalin '50mg'$  BID. We will continue current treatment plan. She may request refill when due. Last refill for 90 days 08/25/2022. She was encouraged to continue working on managing A1C. Healthy lifestyle habits encouraged. She will follow up with PCP as directed. She will return to see me in 1 year, sooner if needed. She verbalizes understanding and agreement with this plan.   No orders of the defined types were placed in this encounter.    No orders of the defined types were placed in this encounter.    Debbora Presto, MSN, FNP-C 10/19/2022, 2:25 PM  [facility] [address] [place], [place] [address] [phone]

## 2022-10-19 NOTE — Patient Instructions (Signed)
Below is our plan:  We will continue pregabalin '50mg'$  twice daily.  Please make sure you are staying well hydrated. I recommend 50-60 ounces daily. Well balanced diet and regular exercise encouraged. Consistent sleep schedule with 6-8 hours recommended.   Please continue follow up with care team as directed.   Follow up with me in 1 year   You may receive a survey regarding today's visit. I encourage you to leave honest feed back as I do use this information to improve patient care. Thank you for seeing me today!

## 2022-10-25 DIAGNOSIS — I10 Essential (primary) hypertension: Secondary | ICD-10-CM | POA: Diagnosis not present

## 2022-10-25 DIAGNOSIS — E1165 Type 2 diabetes mellitus with hyperglycemia: Secondary | ICD-10-CM | POA: Diagnosis not present

## 2022-10-25 DIAGNOSIS — Z0000 Encounter for general adult medical examination without abnormal findings: Secondary | ICD-10-CM | POA: Diagnosis not present

## 2022-10-25 DIAGNOSIS — G629 Polyneuropathy, unspecified: Secondary | ICD-10-CM | POA: Diagnosis not present

## 2022-10-25 DIAGNOSIS — E782 Mixed hyperlipidemia: Secondary | ICD-10-CM | POA: Diagnosis not present

## 2022-10-25 DIAGNOSIS — G2581 Restless legs syndrome: Secondary | ICD-10-CM | POA: Diagnosis not present

## 2022-11-04 ENCOUNTER — Encounter: Payer: Self-pay | Admitting: Internal Medicine

## 2022-11-04 ENCOUNTER — Ambulatory Visit: Payer: BC Managed Care – PPO | Admitting: Internal Medicine

## 2022-11-04 VITALS — BP 120/84 | HR 90 | Ht 63.5 in | Wt 182.8 lb

## 2022-11-04 DIAGNOSIS — E1142 Type 2 diabetes mellitus with diabetic polyneuropathy: Secondary | ICD-10-CM | POA: Diagnosis not present

## 2022-11-04 DIAGNOSIS — E785 Hyperlipidemia, unspecified: Secondary | ICD-10-CM | POA: Diagnosis not present

## 2022-11-04 DIAGNOSIS — E1165 Type 2 diabetes mellitus with hyperglycemia: Secondary | ICD-10-CM | POA: Diagnosis not present

## 2022-11-04 MED ORDER — HUMULIN R U-500 KWIKPEN 500 UNIT/ML ~~LOC~~ SOPN: PEN_INJECTOR | SUBCUTANEOUS | 3 refills | Status: DC

## 2022-11-04 NOTE — Progress Notes (Signed)
Patient ID: Diamond [name], female   DOB: 1966-08-17, 57 y.o.   MRN: [id number]  HPI: Diamond [name] is a 57 y.o.-year-old female, returning for follow-up for DM2, dx in 2019, insulin-dependent since 2020, uncontrolled, with complications (PN).  Last visit 3 months ago.  Interim history: No increased urination, blurry vision, nausea, chest pain. She has dizziness. Since last visit, she was able to start on U-500 insulin.  Reviewed HbA1c: Lab Results  Component Value Date   HGBA1C 9.4 (A) 09/17/2022   HGBA1C 11.8 (A) 06/16/2022   HGBA1C 12.2 (A) 01/27/2022   HGBA1C 11.6 (A) 11/24/2021   HGBA1C 13.0 (A) 06/11/2021   HGBA1C 13.3 (A) 12/26/2020   HGBA1C 14.5 (H) 05/09/2019   HGBA1C 7.3 (H) 05/30/2016   Pt was on: - Tresiba 60 >> 70 >> 80 units daily  - Novolog 8-14 units 15 min before meals - added 01/2022 - not taking >> restarted: Novolog 10-15 >> 20 units 15 min before meals Tried Metformin  IR and ER >> stopped due inefficacy but also GI symptoms.  Now on U500: - 70 units before b'fast - 50 units before lunch  - 50 units before dinner  Patient was not checking sugars at last visit but she did have a CGM which I advised her to attach. She did not.  She was checking sugars >4x a day:  Prev:  Prev.: - am: 221, 265-343 >> 200s-332 - 2h after b'fast: n/c - before lunch: n/c - 2h after lunch: 286, 349 >> 317-452 - before dinner: n/c - 2h after dinner: n/c - bedtime: n/c - nighttime: n/c Lowest sugar was 187 >> 160 >> 83; she has hypoglycemia awareness at 70.  Highest sugar was 300s >> 400s >> HI >> 300s.  Glucometer:One Touch   Meals: - Breakfast: eggs + sausage - Lunch: sandwich, tuna + crackers - Dinner: salad or subs - Snacks:"I am a snacker" veggies, chips, no sweet drinks  - no CKD, last BUN/creatinine:  10/25/2022: 12/0.67, GFR 101, glucose 100 Lab Results  Component Value Date   BUN 11 10/29/2021   BUN 12 05/09/2019   CREATININE 0.52 10/29/2021    CREATININE 0.58 05/09/2019  On telmisartan 40 mg daily.  - + HL: last lipids available for review: 10/25/2022: 179/368/41/79 10/08/2021: 150/204/40/75  Lab Results  Component Value Date   TRIG 938 (H) 06/01/2016  On fenofibrate 160 mg daily.  - last eye exam was in 2022. No DR reportedly.   - + numbness and tingling in her feet.  On Lyrica and Elavil per PCP.  Last foot exam 06/16/2022.  She does have a history of HTN, pancreatitis in 2018 due to hypertriglyceridemia, restless leg syndrome, GERD, headaches, endometriosis.  ROS: + see HPI  Past Medical History:  Diagnosis Date   Breast mass    Axillary lump left side   Diabetes mellitus without complication (HCC)    Endometriosis    history of.   GERD (gastroesophageal reflux disease)    Headache    frequent , now less with Prednisone use of 4 weeks.   History of colon polyps    Hyperlipidemia    Hypertension    Insomnia    Pancreatitis 2017   PONV (postoperative nausea and vomiting)    Restless leg syndrome    Past Surgical History:  Procedure Laterality Date   ABDOMINAL HYSTERECTOMY     "fibroids, endometriosis"/left oophorectomy, in late 20's   ARTERY BIOPSY Right 04/07/2016   Procedure: BIOPSY TEMPORAL ARTERY RIGHT;  Surgeon: [name], MD;  Location: [facility];  Service: General;  Laterality: Right;   bladder tack  2017   COLONOSCOPY WITH PROPOFOL N/A 06/04/2014   Procedure: COLONOSCOPY WITH PROPOFOL;  Surgeon: Garlan Fair, MD;  Location: [facility];  Service: Endoscopy;  Laterality: N/A;, colon polyps removed.   DIAGNOSTIC LAPAROSCOPY     - endometriosis, ovarian cyst, right oophorectomy   RADIOACTIVE SEED GUIDED EXCISIONAL BREAST BIOPSY Right 05/15/2019   Procedure: RADIOACTIVE SEED GUIDED EXCISIONAL RIGHT BREAST BIOPSY;  Surgeon: [name], MD;  Location: [place];  Service: General;  Laterality: Right;   RECTOCELE REPAIR     TMJ ARTHROPLASTY     retained hardware   TONSILLECTOMY     Social History    Socioeconomic History   Marital status: Divorced    Spouse name: Not on file   Number of children: Not on file   Years of education: Not on file   Highest education level: Not on file  Occupational History   Not on file  Tobacco Use   Smoking status: Every Day    Packs/day: 0.50    Years: 30.00    Total pack years: 15.00    Types: Cigarettes    Last attempt to quit: 03/23/2013    Years since quitting: 9.6   Smokeless tobacco: Never   Tobacco comments:    Quit  7'14, 04-06-16" occ. has a rare ciagarette with stress situations"  Substance and Sexual Activity   Alcohol use: Yes    Comment: occ. social,rare   Drug use: No   Sexual activity: Yes  Other Topics Concern   Not on file  Social History Narrative   Caffeine- tea daily    Social Determinants of Health   Financial Resource Strain: Not on file  Food Insecurity: Not on file  Transportation Needs: Not on file  Physical Activity: Not on file  Stress: Not on file  Social Connections: Not on file  Intimate Partner Violence: Not on file   Current Outpatient Medications on File Prior to Visit  Medication Sig Dispense Refill   omeprazole (PRILOSEC) 20 MG capsule 1 capsule 30 minutes before morning meal Orally Once a day for 30 days     amitriptyline (ELAVIL) 25 MG tablet Take 25 mg by mouth at bedtime.     Blood Glucose Monitoring Suppl (ONETOUCH VERIO) w/Device KIT Use as instructed to check blood sugars 3 times daily 1 kit 0   celecoxib (CELEBREX) 200 MG capsule Take 200 mg by mouth daily.     Continuous Blood Gluc Sensor (FREESTYLE LIBRE 3 SENSOR) MISC 1 each by Does not apply route every 14 (fourteen) days. 6 each 3   estradiol (VIVELLE-DOT) 0.1 MG/24HR patch Place 1 patch onto the skin 2 (two) times a week.     fenofibrate 160 MG tablet Take 1 tablet (160 mg total) by mouth daily. 30 tablet 0   glucose blood (ONETOUCH VERIO) test strip Use as instructed to check blood sugars 3 times daily 200 each 3   Insulin Pen  Needle 32G X 4 MM MISC Use 4x a day 300 each 3   insulin regular human CONCENTRATED (HUMULIN R U-500 KWIKPEN) 500 UNIT/ML KwikPen Use up to 200 units a day as advised 12 mL 3   LORazepam (ATIVAN) 1 MG tablet Take 1 mg by mouth daily as needed for anxiety.      OneTouch Delica Lancets 99991111 MISC Use as instructed to check blood sugars 3 times daily 100 each 2  pregabalin (LYRICA) 50 MG capsule Take 1 capsule (50 mg total) by mouth 2 (two) times daily. 180 capsule 1   rOPINIRole (REQUIP) 1 MG tablet Take 1 mg by mouth at bedtime.      telmisartan (MICARDIS) 40 MG tablet Take 40 mg by mouth daily.     zolpidem (AMBIEN) 5 MG tablet Take 5 mg by mouth at bedtime.      No current facility-administered medications on file prior to visit.   Allergies  Allergen Reactions   Paroxetine Hcl Other (See Comments)    Suicidal thoughts   Dulaglutide     Other Reaction(s): abd pain and nausea ?pancreatitis   Family History  Problem Relation Age of Onset   Lupus Mother    Diabetes Father    Hypertension Father    Panic disorder Father    Breast cancer Maternal Uncle    PE: BP 120/84 (BP Location: Right Arm, Patient Position: Sitting, Cuff Size: Normal)   Pulse 90   Ht 5' 3.5" (1.613 m)   Wt 182 lb 12.8 oz (82.9 kg)   SpO2 97%   BMI 31.87 kg/m  Wt Readings from Last 3 Encounters:  11/04/22 182 lb 12.8 oz (82.9 kg)  10/19/22 181 lb 9.6 oz (82.4 kg)  09/17/22 172 lb 6.4 oz (78.2 kg)   Constitutional: overweight, in NAD Eyes:  EOMI, no exophthalmos ENT: no neck masses, no cervical lymphadenopathy Cardiovascular: RRR, No MRG Respiratory: CTA B Musculoskeletal: no deformities Skin:no rashes Neurological: no tremor with outstretched hands  ASSESSMENT: 1. DM2, insulin-dependent, uncontrolled, with complications - PN  2. HL  PLAN:  1. Patient with longstanding, uncontrolled, type 2 diabetes, on basal/bolus insulin regimen at last visit, with poor control.  At that time, she was using high  doses of insulin with very high blood sugars, only 1% within the target range.  At that time, we discussed about improving diet-she was snacking throughout the day, also, discussed about correct injection techniques and I also recommended to change to U-500 insulin.  She was able to do so since last visit. CGM interpretation: -At today's visit, we reviewed her CGM downloads: It appears that 90% of values are in target range (goal >70%), while 81% are higher than 180 (goal <25%), and 0% are lower than 70 (goal <4%).  The calculated average blood sugar is 225.  The projected HbA1c for the next 3 months (GMI) is 8.7%. -Reviewing the CGM trends, sugars appear to be improved compared to before, increasing after dinner and remaining elevated throughout the night until she injects insulin for breakfast.  She is using 6 doses of U-500 insulin before the 3 meals.  For now, as the sugars are decreasing after she injects her breakfast insulin, advised her to continue the same dose.  However, I advised her that for some lunches, she may need to increase the dose by 10 units.  I also advised her to increase the insulin before dinner.  We also discussed about continuing to increase the doses as needed, if the sugars increase after meals.  If the sugars are not increasing after meals and sugars overnight are still high, we may need to use 10 to 20 units of U-500 at bedtime.  She is going to bed late, around 11 to 12 AM.  Another option is to add back [place], but I would use this as a last resort, only if she drops her sugars overnight after increasing U-500 in the evening. -We also discussed about other options,  for example adding an SGLT2 inhibitor, but she would like to hold off for now.  VGo receptor agonists are not ideal for her due to history of pancreatitis. - I suggested to:  Patient Instructions  Please continue with U500: - before b'fast:  70 units - before lunch: 50-60 units - before dinner: 60-70 units  If  the sugars overnight are still high, may need 10-20 units of U500 at bedtime. Another option would be to add back [place] 20 units at bedtime.  Please return in 3 months.  - advised to check sugars at different times of the day - 4x a day, rotating check times - advised for yearly eye exams >> she is not UTD but plans to schedule it - return to clinic in 3 months  2. HL -She has a history of hypertriglyceridemia per review of the records, leading to pancreatitis -I reviewed the latest lipid panel from 10/25/2022: 179/368/41/79: Triglycerides higher than before, but not in the pancreatitis range -She continues on fenofibrate 160 mg daily-no side effects  Philemon Kingdom, MD PhD [facility] Endocrinology

## 2022-11-04 NOTE — Patient Instructions (Addendum)
Please continue with U500: - before b'fast:  70 units - before lunch: 50-60 units - before dinner: 60-70 units  If the sugars overnight are still high, may need 10-20 units of U500 at bedtime. Another option would be to add back [place] 20 units at bedtime.  Please return in 3 months.

## 2022-11-24 ENCOUNTER — Encounter: Payer: Self-pay | Admitting: Psychiatry

## 2022-11-24 ENCOUNTER — Ambulatory Visit: Payer: BC Managed Care – PPO | Admitting: Psychiatry

## 2022-11-24 ENCOUNTER — Telehealth: Payer: Self-pay

## 2022-11-24 VITALS — BP 140/71 | HR 81 | Ht 63.5 in | Wt 187.6 lb

## 2022-11-24 DIAGNOSIS — G629 Polyneuropathy, unspecified: Secondary | ICD-10-CM | POA: Diagnosis not present

## 2022-11-24 MED ORDER — OXCARBAZEPINE 150 MG PO TABS: ORAL_TABLET | ORAL | 6 refills | Status: DC

## 2022-11-24 NOTE — Telephone Encounter (Signed)
Called pt and let her know that Dr. Billey Gosling said that she wanted her to get more blood work done. Pt said that she will come by tomorrow to get her labs done. Pt verbalized understanding.

## 2022-11-24 NOTE — Progress Notes (Signed)
[facility]  PATIENT: Diamond [name] DOB: March 04, 1966  REFERRING CLINICIAN: Leeroy Cha,* HISTORY FROM: self REASON FOR VISIT: neuropathy   HISTORICAL  CHIEF COMPLAINT:  Chief Complaint  Patient presents with   New Patient (Initial Visit)    RM 11, alone. Follow up for neuropathy/chronic issue.  PCP sent her back here d/t issue with reflexes in legs. They were absent. Having pain in R leg. This is intermittent daily. Has hx diabetes. Had MRI back 10/2021, in epic.     HISTORY OF PRESENT ILLNESS:  The patient presents for evaluation of neuropathy. She has a history of chronic neuropathy as well as lumbar stenosis. She reports sharp pains and paresthesias in her feet>hands. Also has a sharp pain around her groin and buttocks. Sometimes this pain will go down to her right thigh as well. Has also started to notice paresthesias in her chest/breast area and sharp pains in her right 3rd and 5th finger. She did present to the ED in February of last year for leg/groin paresthesias and decreased patellar reflexes. MRI L-spine at that time showed stable nerve root impingement at right L4, right L5, and mild-moderate foraminal stenosis at left L5-S1. MRI C-spine January 2023 showed mild right foraminals stenosis at C6-7.  She has a history of poorly controlled diabetes, with last A1c level 9.4 (08/2022, down from previous level of 11.8 05/2022).  She also follows in clinic for occipital neuralgia. Takes Lyrica 50 mg BID for this and is not sure if it is helpful. Headaches have been more frequent recently.  Prior Therapies                                  Elavil 37.5 mg QHS - nausea Paxil - suicidal Gabapentin 600 mg TID - drowsiness Lyrica 50 mg BID - "loopiness" at higher doses lorazepam  OTHER MEDICAL CONDITIONS: DM2, gastroparesis, occipital neuralgia   REVIEW OF SYSTEMS: Full 14 system review of systems performed and negative with exception of:  paresthesias, leg/back pain  ALLERGIES: Allergies  Allergen Reactions   Paroxetine Hcl Other (See Comments)    Suicidal thoughts   Dulaglutide     Other Reaction(s): abd pain and nausea ?pancreatitis    HOME MEDICATIONS: Outpatient Medications Prior to Visit  Medication Sig Dispense Refill   amitriptyline (ELAVIL) 25 MG tablet Take 25 mg by mouth at bedtime.     Blood Glucose Monitoring Suppl (ONETOUCH VERIO) w/Device KIT Use as instructed to check blood sugars 3 times daily 1 kit 0   celecoxib (CELEBREX) 200 MG capsule Take 200 mg by mouth daily.     Continuous Blood Gluc Sensor (FREESTYLE LIBRE 3 SENSOR) MISC 1 each by Does not apply route every 14 (fourteen) days. 6 each 3   estradiol (VIVELLE-DOT) 0.1 MG/24HR patch Place 1 patch onto the skin 2 (two) times a week.     fenofibrate 160 MG tablet Take 1 tablet (160 mg total) by mouth daily. 30 tablet 0   glucose blood (ONETOUCH VERIO) test strip Use as instructed to check blood sugars 3 times daily 200 each 3   Insulin Pen Needle 32G X 4 MM MISC Use 4x a day 300 each 3   insulin regular human CONCENTRATED (HUMULIN R U-500 KWIKPEN) 500 UNIT/ML KwikPen Use up to 220 units a day as advised 18 mL 3   LORazepam (ATIVAN) 1 MG tablet Take 1 mg by mouth daily as needed  for anxiety.      omeprazole (PRILOSEC) 20 MG capsule 1 capsule 30 minutes before morning meal Orally Once a day for 30 days     OneTouch Delica Lancets 99991111 MISC Use as instructed to check blood sugars 3 times daily 100 each 2   pregabalin (LYRICA) 50 MG capsule Take 1 capsule (50 mg total) by mouth 2 (two) times daily. 180 capsule 1   rOPINIRole (REQUIP) 1 MG tablet Take 1 mg by mouth at bedtime.      telmisartan (MICARDIS) 40 MG tablet Take 40 mg by mouth daily.     zolpidem (AMBIEN) 5 MG tablet Take 5 mg by mouth at bedtime.      No facility-administered medications prior to visit.    PAST MEDICAL HISTORY: Past Medical History:  Diagnosis Date   Breast mass     Axillary lump left side   Diabetes mellitus without complication (HCC)    Endometriosis    history of.   GERD (gastroesophageal reflux disease)    Headache    frequent , now less with Prednisone use of 4 weeks.   History of colon polyps    Hyperlipidemia    Hypertension    Insomnia    Pancreatitis 2017   PONV (postoperative nausea and vomiting)    Restless leg syndrome     PAST SURGICAL HISTORY: Past Surgical History:  Procedure Laterality Date   ABDOMINAL HYSTERECTOMY     "fibroids, endometriosis"/left oophorectomy, in late 20's   ARTERY BIOPSY Right 04/07/2016   Procedure: BIOPSY TEMPORAL ARTERY RIGHT;  Surgeon: [name], MD;  Location: [facility];  Service: General;  Laterality: Right;   bladder tack  2017   COLONOSCOPY WITH PROPOFOL N/A 06/04/2014   Procedure: COLONOSCOPY WITH PROPOFOL;  Surgeon: Garlan Fair, MD;  Location: [facility];  Service: Endoscopy;  Laterality: N/A;, colon polyps removed.   DIAGNOSTIC LAPAROSCOPY     - endometriosis, ovarian cyst, right oophorectomy   RADIOACTIVE SEED GUIDED EXCISIONAL BREAST BIOPSY Right 05/15/2019   Procedure: RADIOACTIVE SEED GUIDED EXCISIONAL RIGHT BREAST BIOPSY;  Surgeon: [name], MD;  Location: [place];  Service: General;  Laterality: Right;   RECTOCELE REPAIR     TMJ ARTHROPLASTY     retained hardware   TONSILLECTOMY      FAMILY HISTORY: Family History  Problem Relation Age of Onset   Lupus Mother    Diabetes Father    Hypertension Father    Panic disorder Father    Breast cancer Maternal Uncle     SOCIAL HISTORY: Social History   Socioeconomic History   Marital status: Divorced    Spouse name: Not on file   Number of children: 2   Years of education: 12   Highest education level: Not on file  Occupational History   Not on file  Tobacco Use   Smoking status: Every Day    Packs/day: 0.50    Years: 30.00    Total pack years: 15.00    Types: Cigarettes    Last attempt to quit: 03/23/2013     Years since quitting: 9.6   Smokeless tobacco: Never   Tobacco comments:    Quit  7'14, 04-06-16" occ. has a rare ciagarette with stress situations"  Substance and Sexual Activity   Alcohol use: Yes    Comment: if so, just once per yr   Drug use: No   Sexual activity: Yes  Other Topics Concern   Not on file  Social History Narrative   Caffeine-no  tea/cofee   Drinks diet cokes      Right handed    Social Determinants of Health   Financial Resource Strain: Not on file  Food Insecurity: Not on file  Transportation Needs: Not on file  Physical Activity: Not on file  Stress: Not on file  Social Connections: Not on file  Intimate Partner Violence: Not on file     PHYSICAL EXAM  GENERAL EXAM/CONSTITUTIONAL: Vitals:  Vitals:   11/24/22 1525 11/24/22 1533  BP: (!) 146/85 (!) 140/71  Pulse: 82 81  Weight: 187 lb 9.6 oz (85.1 kg)   Height: 5' 3.5" (1.613 m)    Body mass index is 32.71 kg/m. Wt Readings from Last 3 Encounters:  11/24/22 187 lb 9.6 oz (85.1 kg)  11/04/22 182 lb 12.8 oz (82.9 kg)  10/19/22 181 lb 9.6 oz (82.4 kg)    NEUROLOGIC: MENTAL STATUS:  awake, alert, oriented to person, place and time recent and remote memory intact normal attention and concentration language fluent, comprehension intact, naming intact fund of knowledge appropriate  CRANIAL NERVE:  2nd, 3rd, 4th, 6th - pupils equal and reactive to light, visual fields full to confrontation, extraocular muscles intact, no nystagmus 5th - facial sensation symmetric 7th - facial strength symmetric 8th - hearing intact 9th - palate elevates symmetrically, uvula midline 11th - shoulder shrug symmetric 12th - tongue protrusion midline  MOTOR:  normal bulk and tone, full strength in the BUE, BLE  SENSORY:  Decreased sensation to proprioception and vibration in bilateral feet up to knees  COORDINATION:  finger-nose-finger, fine finger movements normal  REFLEXES:  Decreased patellar reflexes  bilaterally  GAIT/STATION:  normal     DIAGNOSTIC DATA (LABS, IMAGING, TESTING) - I reviewed patient records, labs, notes, testing and imaging myself where available.  Lab Results  Component Value Date   WBC 9.8 10/29/2021   HGB 16.3 (H) 10/29/2021   HCT 48.7 (H) 10/29/2021   MCV 84.7 10/29/2021   PLT 250 10/29/2021      Component Value Date/Time   NA 135 10/29/2021 1252   K 4.0 10/29/2021 1252   CL 103 10/29/2021 1252   CO2 24 10/29/2021 1252   GLUCOSE 325 (H) 10/29/2021 1252   BUN 11 10/29/2021 1252   CREATININE 0.52 10/29/2021 1252   CALCIUM 9.3 10/29/2021 1252   PROT 6.8 06/03/2016 0445   ALBUMIN 3.2 (L) 06/03/2016 0445   AST 24 06/03/2016 0445   ALT 22 06/03/2016 0445   ALKPHOS 42 06/03/2016 0445   BILITOT 0.8 06/03/2016 0445   GFRNONAA >60 10/29/2021 1252   GFRAA >60 05/09/2019 0846   Lab Results  Component Value Date   TRIG 938 (H) 06/01/2016   Lab Results  Component Value Date   HGBA1C 9.4 (A) 09/17/2022      ASSESSMENT AND PLAN  57 y.o.  female with a history of DM2, gastroparesis, occipital neuralgia who presents for evaluation of neuropathy. Her exam reveals decreased sensation to pinprick and proprioception in bilaterally feet with decreased patellar reflexes. Counseled that both neuropathy and lumbar radiculopathy can cause decreased reflexes in the lower extremities, and that she has had documented loss of reflexes for at least one year. Her neuropathy is likely secondary to her uncontrolled diabetes. Will check blood work for other potential causes of neuropathy. Discussed EMG, and she prefers to hold off at this time as her symptoms have been relatively stable.  She has tried multiple first like medications for nerve pain previously, but has been unable to  tolerate them due to side effects. Will continue Lyrica and add oxcarbazepine for neuropathy/occipital neuralgia.   1. Neuropathy       PLAN: -Blood work: TSH, B12, myeloma  panel -Continue Lyrica 50 mg BID -Start oxcarbazepine 150 mg QHS x1 week, then increase to 150 mg BID  Orders Placed This Encounter  Procedures   TSH   Vitamin B12   Multiple Myeloma Panel (SPEP&IFE w/QIG)    Meds ordered this encounter  Medications   OXcarbazepine (TRILEPTAL) 150 MG tablet    Sig: Take 1 pill at bedtime for one week, then increase to 1 pill twice a day    Dispense:  60 tablet    Refill:  6    Return in about 8 months (around 07/27/2023).    Genia Harold, MD 11/24/22 4:27 PM  I spent an average of 56 minutes chart reviewing and counseling the patient, with at least 50% of the time face to face with the patient.   [facility] [address], [place], [place] [address] [phone]

## 2022-11-24 NOTE — Telephone Encounter (Signed)
-----   Message from Genia Harold, MD sent at 11/24/2022  4:28 PM EST ----- Regarding: blood work Can we please inform patient that I reviewed the blood work from her family doctor, and there are still a few blood tests I would like to check. I've placed an order for these tests and she can come have them done any time during office hours

## 2022-11-25 ENCOUNTER — Ambulatory Visit
Admission: RE | Admit: 2022-11-25 | Discharge: 2022-11-25 | Disposition: A | Discharge status: Home / Self Care | Payer: BC Managed Care – PPO | Source: Ambulatory Visit | Attending: Internal Medicine | Admitting: Internal Medicine

## 2022-11-25 ENCOUNTER — Other Ambulatory Visit: Payer: Self-pay

## 2022-11-25 ENCOUNTER — Other Ambulatory Visit (INDEPENDENT_AMBULATORY_CARE_PROVIDER_SITE_OTHER): Payer: Self-pay

## 2022-11-25 DIAGNOSIS — Z1231 Encounter for screening mammogram for malignant neoplasm of breast: Secondary | ICD-10-CM

## 2022-11-25 DIAGNOSIS — Z0289 Encounter for other administrative examinations: Secondary | ICD-10-CM

## 2022-11-25 DIAGNOSIS — G629 Polyneuropathy, unspecified: Secondary | ICD-10-CM

## 2022-12-01 ENCOUNTER — Telehealth: Payer: Self-pay

## 2022-12-01 LAB — MULTIPLE MYELOMA PANEL, SERUM
Albumin SerPl Elph-Mcnc: 3.6 g/dL (ref 2.9–4.4)
Albumin/Glob SerPl: 1.3 (ref 0.7–1.7)
Alpha 1: 0.2 g/dL (ref 0.0–0.4)
Alpha2 Glob SerPl Elph-Mcnc: 0.8 g/dL (ref 0.4–1.0)
B-Globulin SerPl Elph-Mcnc: 0.9 g/dL (ref 0.7–1.3)
Gamma Glob SerPl Elph-Mcnc: 0.9 g/dL (ref 0.4–1.8)
Globulin, Total: 2.8 g/dL (ref 2.2–3.9)
IgA/Immunoglobulin A, Serum: 197 mg/dL (ref 87–352)
IgG (Immunoglobin G), Serum: 719 mg/dL (ref 586–1602)
IgM (Immunoglobulin M), Srm: 209 mg/dL (ref 26–217)
Total Protein: 6.4 g/dL (ref 6.0–8.5)

## 2022-12-01 LAB — TSH: TSH: 3.55 u[IU]/mL (ref 0.450–4.500)

## 2022-12-01 LAB — VITAMIN B12: Vitamin B-12: 429 pg/mL (ref 232–1245)

## 2022-12-01 NOTE — Telephone Encounter (Signed)
Called pt and told her lab results. Her results were Unremarkable labs. Pt verbalized understanding.

## 2023-01-24 DIAGNOSIS — K529 Noninfective gastroenteritis and colitis, unspecified: Secondary | ICD-10-CM | POA: Diagnosis not present

## 2023-01-24 DIAGNOSIS — R197 Diarrhea, unspecified: Secondary | ICD-10-CM | POA: Diagnosis not present

## 2023-01-24 DIAGNOSIS — R1031 Right lower quadrant pain: Secondary | ICD-10-CM | POA: Diagnosis not present

## 2023-01-24 DIAGNOSIS — R1032 Left lower quadrant pain: Secondary | ICD-10-CM | POA: Diagnosis not present

## 2023-02-04 ENCOUNTER — Telehealth: Payer: Self-pay

## 2023-02-04 ENCOUNTER — Ambulatory Visit: Payer: BC Managed Care – PPO | Admitting: Internal Medicine

## 2023-02-04 ENCOUNTER — Encounter: Payer: Self-pay | Admitting: Internal Medicine

## 2023-02-04 VITALS — BP 128/62 | HR 81 | Ht 63.5 in | Wt 189.2 lb

## 2023-02-04 DIAGNOSIS — E785 Hyperlipidemia, unspecified: Secondary | ICD-10-CM | POA: Diagnosis not present

## 2023-02-04 DIAGNOSIS — E1142 Type 2 diabetes mellitus with diabetic polyneuropathy: Secondary | ICD-10-CM

## 2023-02-04 DIAGNOSIS — E1165 Type 2 diabetes mellitus with hyperglycemia: Secondary | ICD-10-CM

## 2023-02-04 DIAGNOSIS — Z794 Long term (current) use of insulin: Secondary | ICD-10-CM | POA: Diagnosis not present

## 2023-02-04 DIAGNOSIS — E119 Type 2 diabetes mellitus without complications: Secondary | ICD-10-CM

## 2023-02-04 LAB — POCT GLYCOSYLATED HEMOGLOBIN (HGB A1C): Hemoglobin A1C: 8.7 % — AB (ref 4.0–5.6)

## 2023-02-04 NOTE — Patient Instructions (Addendum)
Please continue with U500: - before b'fast: 60-80 units - before lunch: 60-80 units - before dinner: 50-70 units  Please return in 3 months.

## 2023-02-04 NOTE — Progress Notes (Signed)
Patient ID: Diamond [name], female   DOB: October 24, 1965, 57 y.o.   MRN: [id number]  HPI: Diamond [name] is a 57 y.o.-year-old female, returning for follow-up for DM2, dx in 2019, insulin-dependent since 2020, uncontrolled, with complications (PN).  Last visit 5 months ago.  Interim history: No increased urination, blurry vision, nausea, chest pain. She continues to have dizziness.  Reviewed HbA1c: Lab Results  Component Value Date   HGBA1C 9.4 (A) 09/17/2022   HGBA1C 11.8 (A) 06/16/2022   HGBA1C 12.2 (A) 01/27/2022   HGBA1C 11.6 (A) 11/24/2021   HGBA1C 13.0 (A) 06/11/2021   HGBA1C 13.3 (A) 12/26/2020   HGBA1C 14.5 (H) 05/09/2019   HGBA1C 7.3 (H) 05/30/2016   Pt was on: - Tresiba 60 >> 70 >> 80 units daily  - Novolog 10-15 >> 20 units 15 min before meals Tried Metformin  IR and ER >> stopped due inefficacy but also GI symptoms.  Now on U500: - 70 units before b'fast >> 80 units - before lunch: 50-60 units >> 80 units - before dinner: 60-70 units >> 80 units (may skip)  Patient checks her blood sugars more than 4 times a day with her freestyle libre CGM:  Previously:  Prev:   Lowest sugar was 187 >> 160 >> 83>> 60s; she has hypoglycemia awareness at 70.  Highest sugar was 300s >> 400s >> HI >> 300s >> 400s.  Glucometer:One Touch   Meals: - Breakfast: eggs + sausage - Lunch: sandwich, tuna + crackers - Dinner: salad or subs - Snacks:"I am a snacker" veggies, chips, no sweet drinks  - no CKD, last BUN/creatinine:  10/25/2022: 12/0.67, GFR 101, glucose 100 Lab Results  Component Value Date   BUN 11 10/29/2021   BUN 12 05/09/2019   CREATININE 0.52 10/29/2021   CREATININE 0.58 05/09/2019  On telmisartan 40 mg daily.  - + HL: last lipids available for review: 10/25/2022: 179/368/41/79 10/08/2021: 150/204/40/75  Lab Results  Component Value Date   TRIG 938 (H) 06/01/2016  On fenofibrate 160 mg daily.  - last eye exam was in 2022. No DR reportedly. Coming up in  03/2023.  - + numbness and tingling in her feet.  On Lyrica and Elavil per PCP.  Last foot exam 06/16/2022.  She does have a history of HTN, pancreatitis in 2018 due to hypertriglyceridemia, restless leg syndrome, GERD, headaches, endometriosis.  ROS: + see HPI  Past Medical History:  Diagnosis Date   Breast mass    Axillary lump left side   Diabetes mellitus without complication (HCC)    Endometriosis    history of.   GERD (gastroesophageal reflux disease)    Headache    frequent , now less with Prednisone use of 4 weeks.   History of colon polyps    Hyperlipidemia    Hypertension    Insomnia    Pancreatitis 2017   PONV (postoperative nausea and vomiting)    Restless leg syndrome    Past Surgical History:  Procedure Laterality Date   ABDOMINAL HYSTERECTOMY     "fibroids, endometriosis"/left oophorectomy, in late 20's   ARTERY BIOPSY Right 04/07/2016   Procedure: BIOPSY TEMPORAL ARTERY RIGHT;  Surgeon: Almond Lint, MD;  Location: [facility];  Service: General;  Laterality: Right;   bladder tack  2017   COLONOSCOPY WITH PROPOFOL N/A 06/04/2014   Procedure: COLONOSCOPY WITH PROPOFOL;  Surgeon: Charolett Bumpers, MD;  Location: [facility];  Service: Endoscopy;  Laterality: N/A;, colon polyps removed.   DIAGNOSTIC LAPAROSCOPY     -  endometriosis, ovarian cyst, right oophorectomy   RADIOACTIVE SEED GUIDED EXCISIONAL BREAST BIOPSY Right 05/15/2019   Procedure: RADIOACTIVE SEED GUIDED EXCISIONAL RIGHT BREAST BIOPSY;  Surgeon: Almond Lint, MD;  Location: [facility];  Service: General;  Laterality: Right;   RECTOCELE REPAIR     TMJ ARTHROPLASTY     retained hardware   TONSILLECTOMY     Social History   Socioeconomic History   Marital status: Divorced    Spouse name: Not on file   Number of children: 2   Years of education: 12   Highest education level: Not on file  Occupational History   Not on file  Tobacco Use   Smoking status: Every Day    Packs/day: 0.50    Years: 30.00     Additional pack years: 0.00    Total pack years: 15.00    Types: Cigarettes    Last attempt to quit: 03/23/2013    Years since quitting: 9.8   Smokeless tobacco: Never   Tobacco comments:    Quit  7'14, 04-06-16" occ. has a rare ciagarette with stress situations"  Substance and Sexual Activity   Alcohol use: Yes    Comment: if so, just once per yr   Drug use: No   Sexual activity: Yes  Other Topics Concern   Not on file  Social History Narrative   Caffeine-no tea/cofee   Drinks diet cokes      Right handed    Social Determinants of Health   Financial Resource Strain: Not on file  Food Insecurity: Not on file  Transportation Needs: Not on file  Physical Activity: Not on file  Stress: Not on file  Social Connections: Not on file  Intimate Partner Violence: Not on file   Current Outpatient Medications on File Prior to Visit  Medication Sig Dispense Refill   amitriptyline (ELAVIL) 25 MG tablet Take 25 mg by mouth at bedtime.     Blood Glucose Monitoring Suppl (ONETOUCH VERIO) w/Device KIT Use as instructed to check blood sugars 3 times daily 1 kit 0   celecoxib (CELEBREX) 200 MG capsule Take 200 mg by mouth daily.     Continuous Blood Gluc Sensor (FREESTYLE LIBRE 3 SENSOR) MISC 1 each by Does not apply route every 14 (fourteen) days. 6 each 3   estradiol (VIVELLE-DOT) 0.1 MG/24HR patch Place 1 patch onto the skin 2 (two) times a week.     fenofibrate 160 MG tablet Take 1 tablet (160 mg total) by mouth daily. 30 tablet 0   glucose blood (ONETOUCH VERIO) test strip Use as instructed to check blood sugars 3 times daily 200 each 3   Insulin Pen Needle 32G X 4 MM MISC Use 4x a day 300 each 3   insulin regular human CONCENTRATED (HUMULIN R U-500 KWIKPEN) 500 UNIT/ML KwikPen Use up to 220 units a day as advised 18 mL 3   LORazepam (ATIVAN) 1 MG tablet Take 1 mg by mouth daily as needed for anxiety.      omeprazole (PRILOSEC) 20 MG capsule 1 capsule 30 minutes before morning meal  Orally Once a day for 30 days     OneTouch Delica Lancets 33G MISC Use as instructed to check blood sugars 3 times daily 100 each 2   OXcarbazepine (TRILEPTAL) 150 MG tablet Take 1 pill at bedtime for one week, then increase to 1 pill twice a day 60 tablet 6   pregabalin (LYRICA) 50 MG capsule Take 1 capsule (50 mg total) by mouth 2 (two)  times daily. 180 capsule 1   rOPINIRole (REQUIP) 1 MG tablet Take 1 mg by mouth at bedtime.      telmisartan (MICARDIS) 40 MG tablet Take 40 mg by mouth daily.     zolpidem (AMBIEN) 5 MG tablet Take 5 mg by mouth at bedtime.      No current facility-administered medications on file prior to visit.   Allergies  Allergen Reactions   Paroxetine Hcl Other (See Comments)    Suicidal thoughts   Dulaglutide     Other Reaction(s): abd pain and nausea ?pancreatitis   Family History  Problem Relation Age of Onset   Lupus Mother    Diabetes Father    Hypertension Father    Panic disorder Father    Breast cancer Maternal Uncle    PE: BP 128/62 (BP Location: Left Arm, Patient Position: Sitting, Cuff Size: Normal)   Pulse 81   Ht 5' 3.5" (1.613 m)   Wt 189 lb 3.2 oz (85.8 kg)   SpO2 96%   BMI 32.99 kg/m  Wt Readings from Last 3 Encounters:  02/04/23 189 lb 3.2 oz (85.8 kg)  11/24/22 187 lb 9.6 oz (85.1 kg)  11/04/22 182 lb 12.8 oz (82.9 kg)   Constitutional: overweight, in NAD Eyes:  EOMI, no exophthalmos ENT: no neck masses, no cervical lymphadenopathy Cardiovascular: RRR, No MRG Respiratory: CTA B Musculoskeletal: no deformities Skin:no rashes Neurological: no tremor with outstretched hands  ASSESSMENT: 1. DM2, insulin-dependent, uncontrolled, with complications - PN  2. HL  PLAN:  1. Patient with longstanding, uncontrolled, type 2 diabetes, on basal/bolus insulin regimen previously, with poor control.  Her control started to improve after switching to U-500 insulin.  We adjusted the doses at last visit.  We also discussed about other  options, for example adding an SGLT2 inhibitor but she wanted to hold off at that time.  A GLP-1 receptor agonist is not ideal for her due to history of pancreatitis. -At last visit we increased the doses before lunch and dinner and discussed that she may also need some U-500 insulin (or some Tresiba) at bedtime.  She did not add this. CGM interpretation: -At today's visit, we reviewed her CGM downloads: It appears that 24% of values are in target range (goal >70%), while 76% are higher than 180 (goal <25%), and 0% are lower than 70 (goal <4%).  The calculated average blood sugar is 231.  The projected HbA1c for the next 3 months (GMI) is 8.8%. -Reviewing the CGM trends, sugars are still mostly above the target range, increasing overnight and then decreasing gradually during the day.  Upon questioning, when sugars are at goal or close to goal before dinner, she is not taking the dinnertime insulin.  There is perpetuates increasing blood sugars overnight and then improving during the day when she actually injects the insulin.  We discussed about using slightly lower doses of insulin before meals at her sugars to improve during the day, and, most importantly, to take the dose of U-500 insulin before dinner, but she would probably need to adjust the dose.  I did advise her to cap the dose at 70 units, but she may need to take even lower doses. - I suggested to:  Patient Instructions  Please continue with U500: - before b'fast: 60-80 units - before lunch: 60-80 units - before dinner: 50-70 units  Please return in 3 months.  - we checked her HbA1c: 8.7% (lower) - advised to check sugars at different times of the  day - 4x a day, rotating check times - advised for yearly eye exams >> she is not UTD but has an appointment coming up - return to clinic in 3 months  2. HL -She has a history of hyper triglyceridemia per review of records, leading to pancreatitis -Reviewed latest lipid panel from 10/2022:  179/368/41/79: Triglycerides were higher than before but not in the pancreatitis range. -She is on fenofibrate 160 mg daily without side effects.  Carlus Pavlov, MD PhD [facility] Endocrinology

## 2023-02-07 ENCOUNTER — Other Ambulatory Visit (HOSPITAL_COMMUNITY): Payer: Self-pay

## 2023-02-07 NOTE — Telephone Encounter (Signed)
Per test claim:  Dexcom is preferred by insurance.   Ran test claim for dexcom G6 sensor and it was successful with a copay of $0.   Please advise

## 2023-02-23 ENCOUNTER — Other Ambulatory Visit: Payer: Self-pay | Admitting: Internal Medicine

## 2023-03-02 DIAGNOSIS — K3184 Gastroparesis: Secondary | ICD-10-CM | POA: Diagnosis not present

## 2023-03-02 DIAGNOSIS — K219 Gastro-esophageal reflux disease without esophagitis: Secondary | ICD-10-CM | POA: Diagnosis not present

## 2023-03-02 DIAGNOSIS — R110 Nausea: Secondary | ICD-10-CM | POA: Diagnosis not present

## 2023-03-14 ENCOUNTER — Other Ambulatory Visit: Payer: Self-pay | Admitting: Psychiatry

## 2023-03-16 ENCOUNTER — Telehealth: Payer: Self-pay | Admitting: Psychiatry

## 2023-03-16 MED ORDER — PREGABALIN 50 MG PO CAPS: 50.0000 mg | ORAL_CAPSULE | Freq: Two times a day (BID) | ORAL | 1 refills | Status: DC

## 2023-03-16 NOTE — Telephone Encounter (Signed)
ERROR, patient called wrong office

## 2023-03-16 NOTE — Telephone Encounter (Signed)
Phone room: please call pt and let her know Amy Lomax,NP called in refill for her

## 2023-03-16 NOTE — Telephone Encounter (Signed)
Pt calling to check on refill for pregabalin (LYRICA) 50 MG capsule. She is Dr. Delena Bali patient and refill has been routed to the work in physician, Dr. Epimenio Foot.

## 2023-03-16 NOTE — Telephone Encounter (Signed)
You are work in AM provider, Dr.Chima patient  Last seen on 10/19/22 per note "We will continue pregabalin 50mg  twice daily. " Follow up scheduled on 08/03/23 Last filled on 12/02/22 #180 tablets (90 day supply) Rx pending to be signed

## 2023-03-16 NOTE — Telephone Encounter (Signed)
Amy- are you ok with calling in pregabalin since you have also seen pt in the past? Thank you   Pt last saw Dr. Delena Bali 11/24/22. Has appt w/ Dr. Delena Bali 08/03/23 and Amy Lomax,NP 10/20/23. Pt has seen Amy Lomax,NP once back on 10/19/22. Pt last refilled pregabalin 50mg  12/02/22 #180.

## 2023-03-16 NOTE — Telephone Encounter (Signed)
Called pt Diamond [name] informing her that refill has been sent to pharmacy.

## 2023-04-20 ENCOUNTER — Other Ambulatory Visit: Payer: Self-pay | Admitting: Internal Medicine

## 2023-05-31 ENCOUNTER — Ambulatory Visit: Payer: BC Managed Care – PPO | Admitting: Internal Medicine

## 2023-06-30 ENCOUNTER — Ambulatory Visit: Payer: BC Managed Care – PPO | Admitting: Internal Medicine

## 2023-06-30 ENCOUNTER — Encounter: Payer: Self-pay | Admitting: Internal Medicine

## 2023-06-30 VITALS — BP 126/80 | HR 98 | Ht 63.5 in | Wt 190.6 lb

## 2023-06-30 DIAGNOSIS — E785 Hyperlipidemia, unspecified: Secondary | ICD-10-CM | POA: Diagnosis not present

## 2023-06-30 DIAGNOSIS — E1165 Type 2 diabetes mellitus with hyperglycemia: Secondary | ICD-10-CM

## 2023-06-30 DIAGNOSIS — Z794 Long term (current) use of insulin: Secondary | ICD-10-CM | POA: Diagnosis not present

## 2023-06-30 DIAGNOSIS — E1142 Type 2 diabetes mellitus with diabetic polyneuropathy: Secondary | ICD-10-CM

## 2023-06-30 LAB — MICROALBUMIN / CREATININE URINE RATIO
Creatinine,U: 40.4 mg/dL
Microalb Creat Ratio: 1.7 mg/g (ref 0.0–30.0)
Microalb, Ur: <0.7 mg/dL (ref 0.0–1.9)

## 2023-06-30 NOTE — Progress Notes (Signed)
Patient ID: Diamond [name], female   DOB: 1965-10-21, 57 y.o.   MRN: [id number]  HPI: Diamond [name] is a 57 y.o.-year-old female, returning for follow-up for DM2, dx in 2019, insulin-dependent since 2020, uncontrolled, with complications (PN).  Last visit 5 months ago.  Interim history: No increased urination, blurry vision, nausea, chest pain. She continues to have dizziness.  Reviewed HbA1c: Lab Results  Component Value Date   HGBA1C 8.7 (A) 02/04/2023   HGBA1C 9.4 (A) 09/17/2022   HGBA1C 11.8 (A) 06/16/2022   HGBA1C 12.2 (A) 01/27/2022   HGBA1C 11.6 (A) 11/24/2021   HGBA1C 13.0 (A) 06/11/2021   HGBA1C 13.3 (A) 12/26/2020   HGBA1C 14.5 (H) 05/09/2019   HGBA1C 7.3 (H) 05/30/2016   Pt was on: - Tresiba 60 >> 70 >> 80 units daily  - Novolog 10-15 >> 20 units 15 min before meals Tried Metformin  IR and ER >> stopped due inefficacy but also GI symptoms.  Now on U500: - 70 units before b'fast >> 80 units - before lunch: 50-60 units >> 80 units - before dinner: 60-70 units >> 80 units (may skip) >>  - before b'fast: 60-80 >> 90 units - before lunch: 60-80 units >> 90 units - before dinner: 50-70 units >> 90 units   Tresiba 20 >> 40 units daily Patient checks her blood sugars more than 4 times a day with her freestyle libre CGM:  Previously:  Previously:   Lowest sugar was 187 >> .Marland Kitchen. 60s >> 89; she has hypoglycemia awareness at 70.  Highest sugar was  HI >> 300s >> 400s >> 400s.  Glucometer:One Touch   Meals: - Breakfast: eggs + sausage - Lunch: sandwich, tuna + crackers - Dinner: salad or subs - Snacks:"I am a snacker" veggies, chips, no sweet drinks  - no CKD, last BUN/creatinine:  10/25/2022: 12/0.67, GFR 101, glucose 100 Lab Results  Component Value Date   BUN 11 10/29/2021   BUN 12 05/09/2019   CREATININE 0.52 10/29/2021   CREATININE 0.58 05/09/2019  No results found for: "MICRALBCREAT" On telmisartan 40 mg daily.  - + HL: last lipids available for  review: 10/25/2022: 179/368/41/79 10/08/2021: 150/204/40/75  Lab Results  Component Value Date   TRIG 938 (H) 06/01/2016  On fenofibrate 160 mg daily.  - last eye exam was in 03/2023. No DR reportedly. She has OS glaucoma.  - + numbness and tingling in her feet.  On Lyrica and Elavil per PCP.  Last foot exam 11/24/2022 - neurology.  She does have a history of HTN, pancreatitis in 2018 due to hypertriglyceridemia, restless leg syndrome, GERD, headaches, endometriosis.  ROS: + see HPI  Past Medical History:  Diagnosis Date   Breast mass    Axillary lump left side   Diabetes mellitus without complication (HCC)    Endometriosis    history of.   GERD (gastroesophageal reflux disease)    Headache    frequent , now less with Prednisone use of 4 weeks.   History of colon polyps    Hyperlipidemia    Hypertension    Insomnia    Pancreatitis 2017   PONV (postoperative nausea and vomiting)    Restless leg syndrome    Past Surgical History:  Procedure Laterality Date   ABDOMINAL HYSTERECTOMY     "fibroids, endometriosis"/left oophorectomy, in late 20's   ARTERY BIOPSY Right 04/07/2016   Procedure: BIOPSY TEMPORAL ARTERY RIGHT;  Surgeon: Almond Lint, MD;  Location: [facility];  Service: General;  Laterality: Right;  bladder tack  2017   COLONOSCOPY WITH PROPOFOL N/A 06/04/2014   Procedure: COLONOSCOPY WITH PROPOFOL;  Surgeon: Charolett Bumpers, MD;  Location: [facility];  Service: Endoscopy;  Laterality: N/A;, colon polyps removed.   DIAGNOSTIC LAPAROSCOPY     - endometriosis, ovarian cyst, right oophorectomy   RADIOACTIVE SEED GUIDED EXCISIONAL BREAST BIOPSY Right 05/15/2019   Procedure: RADIOACTIVE SEED GUIDED EXCISIONAL RIGHT BREAST BIOPSY;  Surgeon: Almond Lint, MD;  Location: [facility];  Service: General;  Laterality: Right;   RECTOCELE REPAIR     TMJ ARTHROPLASTY     retained hardware   TONSILLECTOMY     Social History   Socioeconomic History   Marital status: Divorced     Spouse name: Not on file   Number of children: 2   Years of education: 12   Highest education level: Not on file  Occupational History   Not on file  Tobacco Use   Smoking status: Every Day    Current packs/day: 0.00    Average packs/day: 0.5 packs/day for 30.0 years (15.0 ttl pk-yrs)    Types: Cigarettes    Start date: 03/24/1983    Last attempt to quit: 03/23/2013    Years since quitting: 10.2   Smokeless tobacco: Never   Tobacco comments:    Quit  7'14, 04-06-16" occ. has a rare ciagarette with stress situations"  Substance and Sexual Activity   Alcohol use: Yes    Comment: if so, just once per yr   Drug use: No   Sexual activity: Yes  Other Topics Concern   Not on file  Social History Narrative   Caffeine-no tea/cofee   Drinks diet cokes      Right handed    Social Determinants of Health   Financial Resource Strain: Not on file  Food Insecurity: Not on file  Transportation Needs: Not on file  Physical Activity: Not on file  Stress: Not on file  Social Connections: Unknown (02/01/2022)   [facility]   Social Network    Social Network: Not on file  Intimate Partner Violence: Unknown (12/24/2021)   Received from [facility]   HITS    Physically Hurt: Not on file    Insult or Talk Down To: Not on file    Threaten Physical Harm: Not on file    Scream or Curse: Not on file   Current Outpatient Medications on File Prior to Visit  Medication Sig Dispense Refill   amitriptyline (ELAVIL) 25 MG tablet Take 25 mg by mouth at bedtime.     BD PEN NEEDLE NANO 2ND GEN 32G X 4 MM MISC USE 4 TIMES A DAY 300 each 3   Blood Glucose Monitoring Suppl (ONETOUCH VERIO) w/Device KIT Use as instructed to check blood sugars 3 times daily 1 kit 0   celecoxib (CELEBREX) 200 MG capsule Take 200 mg by mouth daily.     Continuous Glucose Sensor (FREESTYLE LIBRE 3 SENSOR) MISC 1 EACH BY DOES NOT APPLY ROUTE EVERY 14 (FOURTEEN) DAYS. 6 each 3    estradiol (VIVELLE-DOT) 0.1 MG/24HR patch Place 1 patch onto the skin 2 (two) times a week.     fenofibrate 160 MG tablet Take 1 tablet (160 mg total) by mouth daily. 30 tablet 0   glucose blood (ONETOUCH VERIO) test strip Use as instructed to check blood sugars 3 times daily 200 each 3   insulin regular human CONCENTRATED (HUMULIN R U-500 KWIKPEN) 500 UNIT/ML KwikPen Use up to  220 units a day as advised 18 mL 3   LORazepam (ATIVAN) 1 MG tablet Take 1 mg by mouth daily as needed for anxiety.      omeprazole (PRILOSEC) 20 MG capsule 1 capsule 30 minutes before morning meal Orally Once a day for 30 days     OneTouch Delica Lancets 33G MISC Use as instructed to check blood sugars 3 times daily 100 each 2   OXcarbazepine (TRILEPTAL) 150 MG tablet Take 1 pill at bedtime for one week, then increase to 1 pill twice a day 60 tablet 6   pregabalin (LYRICA) 50 MG capsule Take 1 capsule (50 mg total) by mouth 2 (two) times daily. 180 capsule 1   rOPINIRole (REQUIP) 1 MG tablet Take 1 mg by mouth at bedtime.      telmisartan (MICARDIS) 40 MG tablet Take 40 mg by mouth daily.     zolpidem (AMBIEN) 5 MG tablet Take 5 mg by mouth at bedtime.      No current facility-administered medications on file prior to visit.   Allergies  Allergen Reactions   Paroxetine Hcl Other (See Comments)    Suicidal thoughts   Dulaglutide     Other Reaction(s): abd pain and nausea ?pancreatitis   Family History  Problem Relation Age of Onset   Lupus Mother    Diabetes Father    Hypertension Father    Panic disorder Father    Breast cancer Maternal Uncle    PE: There were no vitals taken for this visit. Wt Readings from Last 3 Encounters:  02/04/23 189 lb 3.2 oz (85.8 kg)  11/24/22 187 lb 9.6 oz (85.1 kg)  11/04/22 182 lb 12.8 oz (82.9 kg)   Constitutional: overweight, in NAD Eyes:  EOMI, no exophthalmos ENT: no neck masses, no cervical lymphadenopathy Cardiovascular: RRR, No MRG Respiratory: CTA  B Musculoskeletal: no deformities Skin:no rashes Neurological: no tremor with outstretched hands  ASSESSMENT: 1. DM2, insulin-dependent, uncontrolled, with complications - PN  2. HL  PLAN:  1. Patient with longstanding, uncontrolled, type 2 diabetes, basal-bolus insulin regimen previously, with poor control, currently on U-500 insulin.  A GLP-1 receptor agonist is not indicated for her due to history of pancreatitis.  She declined adding an SGLT2 inhibitor in the past.  We adjusted her U-500 doses at last visit.  At that time, sugars were still mostly above target range, increasing overnight and then increasing gradually during the day.  Upon questioning, when sugars were at goal before dinner, she was not taking the dinnertime insulin with subsequently higher blood sugars overnight and in the morning.  I advised her to take the insulin even if sugars are at goal.  I did advise her to The dose of 70 units at night. CGM interpretation: -At today's visit, we reviewed her CGM downloads: It appears that 7% of values are in target range (goal >70%), while 93% are higher than 180 (goal <25%), and 0% are lower than 70 (goal <4%).  The calculated average blood sugar is 298.  The projected HbA1c for the next 3 months (GMI) is 10.4%. -Reviewing the CGM trends, sugars appear to be very high, usually above 250 and up to 350 or higher. -At today's visit we reviewed how she injects her insulin and it appears that she is injecting it after the meals.  We discussed that she absolutely needs to inject before, at least 30 minutes in advance.  We also discussed about moving the injection sites to make sure she is not  injecting in scar tissue.  I also advised her to increase both the U-500 and the [place] dose.  She does not appear to have other causes of significantly high insulin resistance like infections, active heart disease, steroid treatments. - I suggested to:  Patient Instructions  Please use the following  U500 regimen: - before b'fast: 90-100 units - before lunch: 90-100 units - before dinner: 90-100 units  START INJECTING 30-45 min BEFORE EACH MEALS!  Inject the insulin in upper thighs.  Try to increase:  - Tresiba 60 units daily  Please return in 1.5-2 months.  - advised to check sugars at different times of the day - 4x a day, rotating check times - advised for yearly eye exams >> she is UTD - return to clinic in 3 months  2. HL -She has a history of hypertriglyceridemia in the past, leading to pancreatitis -Latest lipid panel was reviewed from 03/2023: Triglycerides higher than before but not in the pancreatitis range:179/368/41/79 -She continues on fenofibrate 160 mg daily without side effects  Office Visit on 06/30/2023  Component Date Value Ref Range Status   Microalb, Ur 06/30/2023 <0.7  0.0 - 1.9 mg/dL Final   Creatinine,U 16/06/9603 40.4  mg/dL Final   Microalb Creat Ratio 06/30/2023 1.7  0.0 - 30.0 mg/g Final  Normal ACR.  Carlus Pavlov, MD PhD [facility] Endocrinology

## 2023-06-30 NOTE — Patient Instructions (Addendum)
Please use the following U500 regimen: - before b'fast: 90-100 units - before lunch: 90-100 units - before dinner: 90-100 units  START INJECTING 30-45 min BEFORE EACH MEALS!  Inject the insulin in upper thighs.  Try to increase:  - Tresiba 60 units daily  Please return in 1.5-2 months.

## 2023-07-01 ENCOUNTER — Encounter: Payer: Self-pay | Admitting: Internal Medicine

## 2023-07-20 ENCOUNTER — Other Ambulatory Visit: Payer: Self-pay | Admitting: Internal Medicine

## 2023-08-03 ENCOUNTER — Ambulatory Visit: Payer: BC Managed Care – PPO | Admitting: Psychiatry

## 2023-08-15 ENCOUNTER — Ambulatory Visit: Payer: BC Managed Care – PPO | Admitting: Internal Medicine

## 2023-08-15 ENCOUNTER — Encounter: Payer: Self-pay | Admitting: Internal Medicine

## 2023-08-15 VITALS — BP 122/70 | HR 87 | Ht 63.5 in | Wt 199.2 lb

## 2023-08-15 DIAGNOSIS — Z794 Long term (current) use of insulin: Secondary | ICD-10-CM

## 2023-08-15 DIAGNOSIS — E785 Hyperlipidemia, unspecified: Secondary | ICD-10-CM | POA: Diagnosis not present

## 2023-08-15 DIAGNOSIS — E1165 Type 2 diabetes mellitus with hyperglycemia: Secondary | ICD-10-CM

## 2023-08-15 DIAGNOSIS — E1142 Type 2 diabetes mellitus with diabetic polyneuropathy: Secondary | ICD-10-CM | POA: Diagnosis not present

## 2023-08-15 LAB — POCT GLYCOSYLATED HEMOGLOBIN (HGB A1C): Hemoglobin A1C: 8.2 % — AB (ref 4.0–5.6)

## 2023-08-15 NOTE — Progress Notes (Signed)
Patient ID: Diamond [name], female   DOB: 1965/11/08, 57 y.o.   MRN: [id number]  HPI: Diamond [name] is a 57 y.o.-year-old female, returning for follow-up for DM2, dx in 2019, insulin-dependent since 2020, uncontrolled, with complications (PN).  Last visit 1.5 months ago.  Interim history: No increased urination, blurry vision, nausea, chest pain. SHe is frustrated about weight gain -9 pounds since last visit. She had dental work since last visit and was on antibiotics.  She was not able to eat well.  She had some low blood sugars, down to the 40s.    Reviewed HbA1c: Lab Results  Component Value Date   HGBA1C 8.7 (A) 02/04/2023   HGBA1C 9.4 (A) 09/17/2022   HGBA1C 11.8 (A) 06/16/2022   HGBA1C 12.2 (A) 01/27/2022   HGBA1C 11.6 (A) 11/24/2021   HGBA1C 13.0 (A) 06/11/2021   HGBA1C 13.3 (A) 12/26/2020   HGBA1C 14.5 (H) 05/09/2019   HGBA1C 7.3 (H) 05/30/2016   Pt was on: - Tresiba 60 >> 70 >> 80 units daily  - Novolog 10-15 >> 20 units 15 min before meals Tried Metformin  IR and ER >> stopped due inefficacy but also GI symptoms.  Now on U500: - 70 units before b'fast >> 80 units - before lunch: 50-60 units >> 80 units - before dinner: 60-70 units >> 80 units (may skip) >> 45-60 min before a meal - before b'fast: 90-100 units (using 60-90) - before lunch: 90-100 units  (using 60-90) - before dinner: 90-100 units  (using 60-90)  Tresiba 20 >> 40 >> 60 units daily >> taking 40 units occasionally  Patient checks her blood sugars more than 4 times a day with her freestyle libre CGM:  Prev.:  Previously:   Lowest sugar was 60s >> 89 >> 42 (dental work - not eating much); she has hypoglycemia awareness at 70.  Highest sugar was  400s >> 400s >> 300s.  Glucometer:One Touch   Meals: - Breakfast: eggs + sausage - Lunch: sandwich, tuna + crackers - Dinner: salad or subs - Snacks:"I am a snacker" veggies, chips, no sweet drinks  - no CKD, last BUN/creatinine:  10/25/2022:  12/0.67, GFR 101, glucose 100 Lab Results  Component Value Date   BUN 11 10/29/2021   BUN 12 05/09/2019   CREATININE 0.52 10/29/2021   CREATININE 0.58 05/09/2019   Lab Results  Component Value Date   MICRALBCREAT 1.7 06/30/2023  On telmisartan 40 mg daily.  - + HL: last lipids available for review: 10/25/2022: 179/368/41/79 10/08/2021: 150/204/40/75  Lab Results  Component Value Date   TRIG 938 (H) 06/01/2016  On fenofibrate 160 mg daily.  - last eye exam was in 03/2023. No DR reportedly. She has OS glaucoma.  - + numbness and tingling in her feet.  On Lyrica and Elavil per PCP.  Last foot exam 11/24/2022 - neurology.  She does have a history of HTN, pancreatitis in 2018 due to hypertriglyceridemia, restless leg syndrome, GERD, headaches, endometriosis.  ROS: + see HPI  Past Medical History:  Diagnosis Date   Breast mass    Axillary lump left side   Diabetes mellitus without complication (HCC)    Endometriosis    history of.   GERD (gastroesophageal reflux disease)    Headache    frequent , now less with Prednisone use of 4 weeks.   History of colon polyps    Hyperlipidemia    Hypertension    Insomnia    Pancreatitis 2017   PONV (postoperative nausea  and vomiting)    Restless leg syndrome    Past Surgical History:  Procedure Laterality Date   ABDOMINAL HYSTERECTOMY     "fibroids, endometriosis"/left oophorectomy, in late 20's   ARTERY BIOPSY Right 04/07/2016   Procedure: BIOPSY TEMPORAL ARTERY RIGHT;  Surgeon: Almond Lint, MD;  Location: [facility];  Service: General;  Laterality: Right;   bladder tack  2017   COLONOSCOPY WITH PROPOFOL N/A 06/04/2014   Procedure: COLONOSCOPY WITH PROPOFOL;  Surgeon: Charolett Bumpers, MD;  Location: [facility];  Service: Endoscopy;  Laterality: N/A;, colon polyps removed.   DIAGNOSTIC LAPAROSCOPY     - endometriosis, ovarian cyst, right oophorectomy   RADIOACTIVE SEED GUIDED EXCISIONAL BREAST BIOPSY Right 05/15/2019   Procedure:  RADIOACTIVE SEED GUIDED EXCISIONAL RIGHT BREAST BIOPSY;  Surgeon: Almond Lint, MD;  Location: [facility];  Service: General;  Laterality: Right;   RECTOCELE REPAIR     TMJ ARTHROPLASTY     retained hardware   TONSILLECTOMY     Social History   Socioeconomic History   Marital status: Divorced    Spouse name: Not on file   Number of children: 2   Years of education: 12   Highest education level: Not on file  Occupational History   Not on file  Tobacco Use   Smoking status: Every Day    Current packs/day: 0.00    Average packs/day: 0.5 packs/day for 30.0 years (15.0 ttl pk-yrs)    Types: Cigarettes    Start date: 03/24/1983    Last attempt to quit: 03/23/2013    Years since quitting: 10.4   Smokeless tobacco: Never   Tobacco comments:    Quit  7'14, 04-06-16" occ. has a rare ciagarette with stress situations"  Substance and Sexual Activity   Alcohol use: Yes    Comment: if so, just once per yr   Drug use: No   Sexual activity: Yes  Other Topics Concern   Not on file  Social History Narrative   Caffeine-no tea/cofee   Drinks diet cokes      Right handed    Social Determinants of Health   Financial Resource Strain: Not on file  Food Insecurity: Not on file  Transportation Needs: Not on file  Physical Activity: Not on file  Stress: Not on file  Social Connections: Unknown (02/01/2022)   Received from [facility]   Social Network    Social Network: Not on file  Intimate Partner Violence: Unknown (12/24/2021)   Received from [facility]   HITS    Physically Hurt: Not on file    Insult or Talk Down To: Not on file    Threaten Physical Harm: Not on file    Scream or Curse: Not on file   Current Outpatient Medications on File Prior to Visit  Medication Sig Dispense Refill   amitriptyline (ELAVIL) 25 MG tablet Take 25 mg by mouth at bedtime.     BD PEN NEEDLE NANO 2ND GEN 32G X 4 MM MISC USE 4 TIMES A DAY 300 each 3   Blood Glucose Monitoring  Suppl (ONETOUCH VERIO) w/Device KIT Use as instructed to check blood sugars 3 times daily 1 kit 0   celecoxib (CELEBREX) 200 MG capsule Take 200 mg by mouth daily.     Continuous Glucose Sensor (FREESTYLE LIBRE 3 SENSOR) MISC 1 EACH BY DOES NOT APPLY ROUTE EVERY 14 (FOURTEEN) DAYS. 6 each 3   estradiol (VIVELLE-DOT) 0.1 MG/24HR patch Place 1 patch onto the skin  2 (two) times a week.     fenofibrate 160 MG tablet Take 1 tablet (160 mg total) by mouth daily. 30 tablet 0   glucose blood (ONETOUCH VERIO) test strip Use as instructed to check blood sugars 3 times daily 200 each 3   insulin regular human CONCENTRATED (HUMULIN R U-500 KWIKPEN) 500 UNIT/ML KwikPen USE UP TO 220 UNITS A DAY AS ADVISED 60 mL 1   LORazepam (ATIVAN) 1 MG tablet Take 1 mg by mouth daily as needed for anxiety.      omeprazole (PRILOSEC) 20 MG capsule 1 capsule 30 minutes before morning meal Orally Once a day for 30 days     OneTouch Delica Lancets 33G MISC Use as instructed to check blood sugars 3 times daily 100 each 2   OXcarbazepine (TRILEPTAL) 150 MG tablet Take 1 pill at bedtime for one week, then increase to 1 pill twice a day 60 tablet 6   pregabalin (LYRICA) 50 MG capsule Take 1 capsule (50 mg total) by mouth 2 (two) times daily. 180 capsule 1   rOPINIRole (REQUIP) 1 MG tablet Take 1 mg by mouth at bedtime.      telmisartan (MICARDIS) 40 MG tablet Take 40 mg by mouth daily.     zolpidem (AMBIEN) 5 MG tablet Take 5 mg by mouth at bedtime.      No current facility-administered medications on file prior to visit.   Allergies  Allergen Reactions   Paroxetine Hcl Other (See Comments)    Suicidal thoughts   Dulaglutide     Other Reaction(s): abd pain and nausea ?pancreatitis   Family History  Problem Relation Age of Onset   Lupus Mother    Diabetes Father    Hypertension Father    Panic disorder Father    Breast cancer Maternal Uncle    PE: BP 122/70   Pulse 87   Ht 5' 3.5" (1.613 m)   Wt 199 lb 3.2 oz (90.4  kg)   SpO2 97%   BMI 34.73 kg/m  Wt Readings from Last 3 Encounters:  08/15/23 199 lb 3.2 oz (90.4 kg)  06/30/23 190 lb 9.6 oz (86.5 kg)  02/04/23 189 lb 3.2 oz (85.8 kg)   Constitutional: overweight, in NAD Eyes:  EOMI, no exophthalmos ENT: no neck masses, no cervical lymphadenopathy Cardiovascular: RRR, No MRG Respiratory: CTA B Musculoskeletal: no deformities Skin:no rashes Neurological: no tremor with outstretched hands  ASSESSMENT: 1. DM2, insulin-dependent, uncontrolled, with complications - PN  2. HL  PLAN:  1. Patient with longstanding, uncontrolled, type 2 diabetes, on concentrated U-500 insulin along with long-acting insulin, with still poor control.  A GLP-1 receptor agonist is not indicated for her due to history of pancreatitis.  She declined adding an SGLT2 inhibitor in the past.  At last visit, we adjusted her U-500 insulin as the sugars are very high, in the 200s and up to 350 or higher.  We also increased her Tresiba dose.  She did not appear to have thyroid causes a significantly high insulin resistance like infections, active heart disease, steroid treatments.  HbA1c at that time was high, at 8.7%.  I did advise her to come back in 1.5 to 2 months but she presents after 6 months. CGM interpretation: -At today's visit, we reviewed her CGM downloads: It appears that 49% of values are in target range (goal >70%), while 51% are higher than 180 (goal <25%), and 0% are lower than 70 (goal <4%).  The calculated average blood sugar is  183.  The projected HbA1c for the next 3 months (GMI) is 7.7%. -Reviewing the CGM trends, sugars have improved, now fluctuating around the ULN range - with higher values after meals but especially overnight, peaking ~5 am.  Upon questioning, she tried to use the size 4 injection sites but she started to have leg pain and moved the injections to the stomach.  However, she feels that after our last visit, when she moved the insulin injections 45  minutes to an hour before a meal, her sugars started to improve significantly.  She is changing the dose based on the sugars before the meals, between 60 to 90 units.  She had dental work recently and had some low blood sugars, down to the 40s but now eating better and changing the dose of insulin based on the sugars so she has not seen such low values recently.  She tells me that she is taking [place] at a lower dose than recommended, 40 units instead of 60 units but only occasionally.  I explained that this is likely the reason why the sugars are increasing gradually overnight.  I advised her to start taking [place] every day, we can stay with 40 units. - I suggested to:  Patient Instructions  Please use the following U500 regimen: - 45-60 min before meals: 60-100 units  Also use:  - Tresiba 40 units daily  Please return 2-3 months.  - we checked her HbA1c: 8.2% (lower) - advised to check sugars at different times of the day - 4x a day, rotating check times - advised for yearly eye exams >> she is UTD - return to clinic in 2-3 months  2. HL -she has a h/o HTG in the past, leading to pancreatitis -latest Lipid panel was reviewed from 03/2023: TG higher than before, but not in the pancreatitis range:179/368/41/79 -She continues on Fenofibrate 160 mg daily - with good tolerance  Carlus Pavlov, MD PhD [facility] Endocrinology

## 2023-08-15 NOTE — Patient Instructions (Addendum)
Please use the following U500 regimen: - 45-60 min before meals: 60-100 units  Also use:  - Tresiba 40 units daily  Please return 2-3 months.

## 2023-09-29 DIAGNOSIS — H40053 Ocular hypertension, bilateral: Secondary | ICD-10-CM | POA: Diagnosis not present

## 2023-09-29 DIAGNOSIS — H40013 Open angle with borderline findings, low risk, bilateral: Secondary | ICD-10-CM | POA: Diagnosis not present

## 2023-10-17 ENCOUNTER — Ambulatory Visit: Payer: BC Managed Care – PPO | Admitting: Internal Medicine

## 2023-10-19 NOTE — Patient Instructions (Signed)

## 2023-10-19 NOTE — Progress Notes (Unsigned)
No chief complaint on file.   HISTORY OF PRESENT ILLNESS:  10/19/23 ALL:  Diamond [name] returns for follow up for occipital neuralgia and neuropathy. She was last seen by Dr Delena Bali 11/2022. Lyrica 50mg  BID continued and oxcarb 150mg  BID added. Since,   10/19/2022 ALL:  Diamond [name] is a 58 y.o. female here today for follow up for bilateral occipital neuralgia and nerve pain. She continues Lyrica 50mg  BID. She feels that initially the pain worsened but most recently pain has seemed to be fairly stable. She does continue to have numbness of scalp and face on occasion. She has random nerve pains on groin, hands, legs and feet. She is followed by orthopedics, endo and PCP regularly. Last A1C 122023 was 9.4, down from 11.8 in 05/2022. Glucose readings have improved since change of meds. Amitriptyline 25mg  at bedtime prescribed by PCP for gastroparesis. Ropinirole used for RLS.   Last refill of pregabalin was for 90 days on 08/25/2022.   HISTORY (copied from Dr Quentin Mulling previous note)  Brief HPI: 58 year old female with a history of DM2 and gastroparesis who follows in clinic for occipital neuralgia and nerve pain.   At her last visit she was started on Lyrica for headache prevention and nerve pain.    Interval History: Headaches have improved with Lyrica 50 mg BID. She occasionally gets shooting pains but they are less frequent now.   Continues to have pain in her hands and feet though these have also improved with Lyrica. Fingers will have intermittent burning and will lock up when she holds things. She occasionally get muscle twitching in her bicep and hands. Toes will cramp up as well. Continues to have shooting pain down her right leg but it is less severe than it was before Lyrica.   Current Headache Regimen: Preventative: Lyrica 50 mg BID   Prior Therapies                                  Elavil 37.5 mg QHS - nausea Gabapentin 600 mg TID - drowsiness Lyrica 50 mg  BID lorazepam   REVIEW OF SYSTEMS: Out of a complete 14 system review of symptoms, the patient complains only of the following symptoms, right thigh pain, low back pain, numbness, sharp pains throughout body and all other reviewed systems are negative.   ALLERGIES: Allergies  Allergen Reactions   Paroxetine Hcl Other (See Comments)    Suicidal thoughts   Dulaglutide     Other Reaction(s): abd pain and nausea ?pancreatitis     HOME MEDICATIONS: Outpatient Medications Prior to Visit  Medication Sig Dispense Refill   amitriptyline (ELAVIL) 25 MG tablet Take 25 mg by mouth at bedtime.     BD PEN NEEDLE NANO 2ND GEN 32G X 4 MM MISC USE 4 TIMES A DAY 300 each 3   Blood Glucose Monitoring Suppl (ONETOUCH VERIO) w/Device KIT Use as instructed to check blood sugars 3 times daily 1 kit 0   celecoxib (CELEBREX) 200 MG capsule Take 200 mg by mouth daily.     Continuous Glucose Sensor (FREESTYLE LIBRE 3 SENSOR) MISC 1 EACH BY DOES NOT APPLY ROUTE EVERY 14 (FOURTEEN) DAYS. 6 each 3   estradiol (VIVELLE-DOT) 0.1 MG/24HR patch Place 1 patch onto the skin 2 (two) times a week.     fenofibrate 160 MG tablet Take 1 tablet (160 mg total) by mouth daily. 30 tablet 0  glucose blood (ONETOUCH VERIO) test strip Use as instructed to check blood sugars 3 times daily 200 each 3   insulin regular human CONCENTRATED (HUMULIN R U-500 KWIKPEN) 500 UNIT/ML KwikPen USE UP TO 220 UNITS A DAY AS ADVISED 60 mL 1   LORazepam (ATIVAN) 1 MG tablet Take 1 mg by mouth daily as needed for anxiety.      omeprazole (PRILOSEC) 20 MG capsule 1 capsule 30 minutes before morning meal Orally Once a day for 30 days     OneTouch Delica Lancets 33G MISC Use as instructed to check blood sugars 3 times daily 100 each 2   OXcarbazepine (TRILEPTAL) 150 MG tablet Take 1 pill at bedtime for one week, then increase to 1 pill twice a day 60 tablet 6   pregabalin (LYRICA) 50 MG capsule Take 1 capsule (50 mg total) by mouth 2 (two) times  daily. 180 capsule 1   rOPINIRole (REQUIP) 1 MG tablet Take 1 mg by mouth at bedtime.      telmisartan (MICARDIS) 40 MG tablet Take 40 mg by mouth daily.     zolpidem (AMBIEN) 5 MG tablet Take 5 mg by mouth at bedtime.      No facility-administered medications prior to visit.     PAST MEDICAL HISTORY: Past Medical History:  Diagnosis Date   Breast mass    Axillary lump left side   Diabetes mellitus without complication (HCC)    Endometriosis    history of.   GERD (gastroesophageal reflux disease)    Headache    frequent , now less with Prednisone use of 4 weeks.   History of colon polyps    Hyperlipidemia    Hypertension    Insomnia    Pancreatitis 2017   PONV (postoperative nausea and vomiting)    Restless leg syndrome      PAST SURGICAL HISTORY: Past Surgical History:  Procedure Laterality Date   ABDOMINAL HYSTERECTOMY     "fibroids, endometriosis"/left oophorectomy, in late 20's   ARTERY BIOPSY Right 04/07/2016   Procedure: BIOPSY TEMPORAL ARTERY RIGHT;  Surgeon: Almond Lint, MD;  Location: [facility];  Service: General;  Laterality: Right;   bladder tack  2017   COLONOSCOPY WITH PROPOFOL N/A 06/04/2014   Procedure: COLONOSCOPY WITH PROPOFOL;  Surgeon: Charolett Bumpers, MD;  Location: [facility];  Service: Endoscopy;  Laterality: N/A;, colon polyps removed.   DIAGNOSTIC LAPAROSCOPY     - endometriosis, ovarian cyst, right oophorectomy   RADIOACTIVE SEED GUIDED EXCISIONAL BREAST BIOPSY Right 05/15/2019   Procedure: RADIOACTIVE SEED GUIDED EXCISIONAL RIGHT BREAST BIOPSY;  Surgeon: Almond Lint, MD;  Location: [facility];  Service: General;  Laterality: Right;   RECTOCELE REPAIR     TMJ ARTHROPLASTY     retained hardware   TONSILLECTOMY       FAMILY HISTORY: Family History  Problem Relation Age of Onset   Lupus Mother    Diabetes Father    Hypertension Father    Panic disorder Father    Breast cancer Maternal Uncle      SOCIAL HISTORY: Social History    Socioeconomic History   Marital status: Divorced    Spouse name: Not on file   Number of children: 2   Years of education: 12   Highest education level: Not on file  Occupational History   Not on file  Tobacco Use   Smoking status: Every Day    Current packs/day: 0.00    Average packs/day: 0.5 packs/day for 30.0 years (15.0 ttl  pk-yrs)    Types: Cigarettes    Start date: 03/24/1983    Last attempt to quit: 03/23/2013    Years since quitting: 10.5   Smokeless tobacco: Never   Tobacco comments:    Quit  7'14, 04-06-16" occ. has a rare ciagarette with stress situations"  Substance and Sexual Activity   Alcohol use: Yes    Comment: if so, just once per yr   Drug use: No   Sexual activity: Yes  Other Topics Concern   Not on file  Social History Narrative   Caffeine-no tea/cofee   Drinks diet cokes      Right handed    Social Drivers of Health   Financial Resource Strain: Not on file  Food Insecurity: Not on file  Transportation Needs: Not on file  Physical Activity: Not on file  Stress: Not on file  Social Connections: Unknown (02/01/2022)   Received from [facility]   Social Network    Social Network: Not on file  Intimate Partner Violence: Unknown (12/24/2021)   Received from [facility], [facility]   HITS    Physically Hurt: Not on file    Insult or Talk Down To: Not on file    Threaten Physical Harm: Not on file    Scream or Curse: Not on file     PHYSICAL EXAM  There were no vitals filed for this visit.  There is no height or weight on file to calculate BMI.  Generalized: Well developed, in no acute distress  Cardiology: normal rate and rhythm, no murmur auscultated  Respiratory: clear to auscultation bilaterally    Neurological examination  Mentation: Alert oriented to time, place, history taking. Follows all commands speech and language fluent Cranial nerve II-XII: Pupils were equal round reactive to light. Extraocular movements  were full, visual field were full on confrontational test. Facial sensation and strength were normal. Uvula tongue midline. Head turning and shoulder shrug  were normal and symmetric. Motor: The motor testing reveals 5 over 5 strength of all 4 extremities. Good symmetric motor tone is noted throughout.  Sensory: Sensory testing is intact to soft touch on all 4 extremities. No evidence of extinction is noted.  Coordination: Cerebellar testing reveals good finger-nose-finger and heel-to-shin bilaterally.  Gait and station: Gait is normal. Tandem gait is normal. Romberg is negative. No drift is seen.  Reflexes: Deep tendon reflexes are symmetric and normal bilaterally.    DIAGNOSTIC DATA (LABS, IMAGING, TESTING) - I reviewed patient records, labs, notes, testing and imaging myself where available.  Lab Results  Component Value Date   WBC 9.8 10/29/2021   HGB 16.3 (H) 10/29/2021   HCT 48.7 (H) 10/29/2021   MCV 84.7 10/29/2021   PLT 250 10/29/2021      Component Value Date/Time   NA 135 10/29/2021 1252   K 4.0 10/29/2021 1252   CL 103 10/29/2021 1252   CO2 24 10/29/2021 1252   GLUCOSE 325 (H) 10/29/2021 1252   BUN 11 10/29/2021 1252   CREATININE 0.52 10/29/2021 1252   CALCIUM 9.3 10/29/2021 1252   PROT 6.4 11/25/2022 1610   ALBUMIN 3.2 (L) 06/03/2016 0445   AST 24 06/03/2016 0445   ALT 22 06/03/2016 0445   ALKPHOS 42 06/03/2016 0445   BILITOT 0.8 06/03/2016 0445   GFRNONAA >60 10/29/2021 1252   GFRAA >60 05/09/2019 0846   Lab Results  Component Value Date   TRIG 938 (H) 06/01/2016   Lab Results  Component Value Date  HGBA1C 8.2 (A) 08/15/2023   Lab Results  Component Value Date   VITAMINB12 429 11/25/2022   Lab Results  Component Value Date   TSH 3.550 11/25/2022        No data to display               No data to display           ASSESSMENT AND PLAN  58 y.o. year old female  has a past medical history of Breast mass, Diabetes mellitus without  complication (HCC), Endometriosis, GERD (gastroesophageal reflux disease), Headache, History of colon polyps, Hyperlipidemia, Hypertension, Insomnia, Pancreatitis (2017), PONV (postoperative nausea and vomiting), and Restless leg syndrome. here with    No diagnosis found.  Diamond [name] is doing well. She reports occipital neuralgia and nerve pain is stable on pregabalin 50mg  BID. We will continue current treatment plan. She may request refill when due. Last refill for 90 days 08/25/2022. She was encouraged to continue working on managing A1C. Healthy lifestyle habits encouraged. She will follow up with PCP as directed. She will return to see me in 1 year, sooner if needed. She verbalizes understanding and agreement with this plan.   No orders of the defined types were placed in this encounter.    No orders of the defined types were placed in this encounter.    Shawnie Dapper, MSN, FNP-C 10/19/2023, 3:49 PM  [facility] [address], [address] [place], [place] [address] [phone]

## 2023-10-20 ENCOUNTER — Ambulatory Visit: Payer: BC Managed Care – PPO | Admitting: Family Medicine

## 2023-10-20 ENCOUNTER — Encounter: Payer: Self-pay | Admitting: Family Medicine

## 2023-10-20 VITALS — BP 130/71 | HR 86 | Ht 63.5 in | Wt 195.4 lb

## 2023-10-20 DIAGNOSIS — G629 Polyneuropathy, unspecified: Secondary | ICD-10-CM

## 2023-10-20 DIAGNOSIS — M5416 Radiculopathy, lumbar region: Secondary | ICD-10-CM

## 2023-10-20 DIAGNOSIS — M5481 Occipital neuralgia: Secondary | ICD-10-CM | POA: Diagnosis not present

## 2023-10-31 DIAGNOSIS — K219 Gastro-esophageal reflux disease without esophagitis: Secondary | ICD-10-CM | POA: Diagnosis not present

## 2023-10-31 DIAGNOSIS — Z0000 Encounter for general adult medical examination without abnormal findings: Secondary | ICD-10-CM | POA: Diagnosis not present

## 2023-10-31 DIAGNOSIS — G2581 Restless legs syndrome: Secondary | ICD-10-CM | POA: Diagnosis not present

## 2023-10-31 DIAGNOSIS — G4700 Insomnia, unspecified: Secondary | ICD-10-CM | POA: Diagnosis not present

## 2023-11-10 ENCOUNTER — Ambulatory Visit: Payer: BC Managed Care – PPO | Admitting: Internal Medicine

## 2024-01-03 DIAGNOSIS — H25043 Posterior subcapsular polar age-related cataract, bilateral: Secondary | ICD-10-CM | POA: Diagnosis not present

## 2024-01-03 DIAGNOSIS — H2512 Age-related nuclear cataract, left eye: Secondary | ICD-10-CM | POA: Diagnosis not present

## 2024-01-03 DIAGNOSIS — H18413 Arcus senilis, bilateral: Secondary | ICD-10-CM | POA: Diagnosis not present

## 2024-01-03 DIAGNOSIS — H2513 Age-related nuclear cataract, bilateral: Secondary | ICD-10-CM | POA: Diagnosis not present

## 2024-01-03 DIAGNOSIS — H25013 Cortical age-related cataract, bilateral: Secondary | ICD-10-CM | POA: Diagnosis not present

## 2024-01-06 DIAGNOSIS — H2512 Age-related nuclear cataract, left eye: Secondary | ICD-10-CM | POA: Diagnosis not present

## 2024-02-01 ENCOUNTER — Other Ambulatory Visit: Payer: Self-pay | Admitting: *Deleted

## 2024-02-02 ENCOUNTER — Telehealth: Payer: Self-pay | Admitting: *Deleted

## 2024-02-02 MED ORDER — PREGABALIN 50 MG PO CAPS: 50.0000 mg | ORAL_CAPSULE | Freq: Two times a day (BID) | ORAL | 1 refills | Status: AC

## 2024-02-02 MED ORDER — OXCARBAZEPINE 150 MG PO TABS: 150.0000 mg | ORAL_TABLET | Freq: Every day | ORAL | 2 refills | Status: AC

## 2024-02-02 NOTE — Telephone Encounter (Signed)
 Patient called back from voicemail left by [facility]. I didn't see any documentation about the call. Pt said Jeneane Miracle asked her about the oxcarbazepine  150 mg and the pregabalin  50 mg capsule. I see that the refill was routed to Amy for pregabalin .   Pt said she needs refill on oxcarbazepine  as well. Per note on 09/2023 " We will continue Lyrica  50mg  twice daily and oxcarbazepine  150mg  at bedtime "  Rx sent.

## 2024-02-23 ENCOUNTER — Other Ambulatory Visit: Payer: Self-pay | Admitting: Internal Medicine

## 2024-02-28 DIAGNOSIS — K581 Irritable bowel syndrome with constipation: Secondary | ICD-10-CM | POA: Diagnosis not present

## 2024-02-28 DIAGNOSIS — R159 Full incontinence of feces: Secondary | ICD-10-CM | POA: Diagnosis not present

## 2024-02-28 DIAGNOSIS — K219 Gastro-esophageal reflux disease without esophagitis: Secondary | ICD-10-CM | POA: Diagnosis not present

## 2024-02-28 DIAGNOSIS — Z860100 Personal history of colon polyps, unspecified: Secondary | ICD-10-CM | POA: Diagnosis not present

## 2024-04-27 DIAGNOSIS — K2930 Chronic superficial gastritis without bleeding: Secondary | ICD-10-CM | POA: Diagnosis not present

## 2024-04-27 DIAGNOSIS — Z860101 Personal history of adenomatous and serrated colon polyps: Secondary | ICD-10-CM | POA: Diagnosis not present

## 2024-04-27 DIAGNOSIS — K3189 Other diseases of stomach and duodenum: Secondary | ICD-10-CM | POA: Diagnosis not present

## 2024-04-27 DIAGNOSIS — K317 Polyp of stomach and duodenum: Secondary | ICD-10-CM | POA: Diagnosis not present

## 2024-04-27 DIAGNOSIS — K219 Gastro-esophageal reflux disease without esophagitis: Secondary | ICD-10-CM | POA: Diagnosis not present

## 2024-04-27 DIAGNOSIS — R110 Nausea: Secondary | ICD-10-CM | POA: Diagnosis not present

## 2024-04-27 DIAGNOSIS — K635 Polyp of colon: Secondary | ICD-10-CM | POA: Diagnosis not present

## 2024-04-27 DIAGNOSIS — Z09 Encounter for follow-up examination after completed treatment for conditions other than malignant neoplasm: Secondary | ICD-10-CM | POA: Diagnosis not present

## 2024-05-09 ENCOUNTER — Encounter: Payer: Self-pay | Admitting: Internal Medicine

## 2024-05-09 ENCOUNTER — Ambulatory Visit: Admitting: Internal Medicine

## 2024-05-09 VITALS — BP 124/80 | HR 76 | Ht 63.5 in | Wt 207.0 lb

## 2024-05-09 DIAGNOSIS — E1142 Type 2 diabetes mellitus with diabetic polyneuropathy: Secondary | ICD-10-CM | POA: Diagnosis not present

## 2024-05-09 DIAGNOSIS — E1165 Type 2 diabetes mellitus with hyperglycemia: Secondary | ICD-10-CM | POA: Diagnosis not present

## 2024-05-09 DIAGNOSIS — Z794 Long term (current) use of insulin: Secondary | ICD-10-CM

## 2024-05-09 DIAGNOSIS — E785 Hyperlipidemia, unspecified: Secondary | ICD-10-CM

## 2024-05-09 DIAGNOSIS — E66812 Obesity, class 2: Secondary | ICD-10-CM | POA: Diagnosis not present

## 2024-05-09 LAB — POCT GLYCOSYLATED HEMOGLOBIN (HGB A1C): Hemoglobin A1C: 8.3 % — AB (ref 4.0–5.6)

## 2024-05-09 MED ORDER — FREESTYLE LIBRE 3 PLUS SENSOR MISC: 1.0000 | 3 refills | Status: AC

## 2024-05-09 NOTE — Progress Notes (Signed)
 Patient ID: Diamond [name], female   DOB: [birth year], 58 y.o.   MRN: [id number]  HPI: Diamond [name] is a 58 y.o.-year-old female, returning for follow-up for DM2, dx in 2019, insulin -dependent since 2020, uncontrolled, with complications (PN).  Last visit 9 months ago.  Interim history: No increased urination, blurry vision, nausea, chest pain. She had cataract sx OS 12/2023.   She recently had an EGD and colonoscopy.  She has ongoing GI issues including nausea. Her sugars remain elevated even if fasting, for example at the time of her colonoscopy, despite using higher doses of insulin  than recommended at last visit.  Reviewed HbA1c: Lab Results  Component Value Date   HGBA1C 8.2 (A) 08/15/2023   HGBA1C 8.7 (A) 02/04/2023   HGBA1C 9.4 (A) 09/17/2022   HGBA1C 11.8 (A) 06/16/2022   HGBA1C 12.2 (A) 01/27/2022   HGBA1C 11.6 (A) 11/24/2021   HGBA1C 13.0 (A) 06/11/2021   HGBA1C 13.3 (A) 12/26/2020   HGBA1C 14.5 (H) 05/09/2019   HGBA1C 7.3 (H) 05/30/2016   Pt was on: - Tresiba  60 >> 70 >> 80 units daily  - Novolog  10-15 >> 20 units 15 min before meals Tried Metformin  IR and ER >> stopped due inefficacy but also GI symptoms.  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  Highest sugar was  400s >> 400s >> 300s.  Glucometer:One Touch   Meals: - Breakfast: eggs + sausage - Lunch: sandwich, tuna + crackers - Dinner: salad or subs - Snacks:I am a snacker veggies, chips, no sweet drinks  - no CKD, last BUN/creatinine:     Lab Results  Component Value Date   BUN 11 10/29/2021   BUN 12 05/09/2019   CREATININE 0.52 10/29/2021   CREATININE 0.58 05/09/2019   No results found for: MICRALBCREAT On telmisartan 40 mg daily.  - + HL: last lipids available for review:  10/25/2022: 179/368/41/79 10/08/2021: 150/204/40/75  Lab Results  Component Value Date   TRIG 938 (H) 06/01/2016  On fenofibrate  160 mg daily.  - last eye exam was in 2025. No DR reportedly. She has OS glaucoma.  - + numbness and tingling in her feet.  On Lyrica  and Elavil per PCP.  Last foot exam 11/24/2022 - neurology.  She does have a history of HTN, pancreatitis in 2018 due to hypertriglyceridemia, restless leg syndrome, GERD, headaches, endometriosis.  ROS: + see HPI  Past Medical History:  Diagnosis Date   Breast mass    Axillary lump left side   Diabetes mellitus without complication (HCC)    Endometriosis    history of.   GERD (gastroesophageal reflux disease)    Headache    frequent , now less with Prednisone use of 4 weeks.   History of colon polyps    Hyperlipidemia    Hypertension    Insomnia    Pancreatitis 2017   PONV (postoperative nausea and vomiting)    Restless leg syndrome  Past Surgical History:  Procedure Laterality Date   ABDOMINAL HYSTERECTOMY     fibroids, endometriosis/left oophorectomy, in late 20's   ARTERY BIOPSY Right 04/07/2016   Procedure: BIOPSY TEMPORAL ARTERY RIGHT;  Surgeon: Jina Nephew, MD;  Location: [facility];  Service: General;  Laterality: Right;   bladder tack  2017   COLONOSCOPY WITH PROPOFOL  N/A 06/04/2014   Procedure: COLONOSCOPY WITH PROPOFOL ;  Surgeon: Gladis MARLA Louder, MD;  Location: [facility];  Service: Endoscopy;  Laterality: N/A;, colon polyps removed.   DIAGNOSTIC LAPAROSCOPY     - endometriosis, ovarian cyst, right oophorectomy   RADIOACTIVE SEED GUIDED EXCISIONAL BREAST BIOPSY Right 05/15/2019   Procedure: RADIOACTIVE SEED GUIDED EXCISIONAL RIGHT BREAST BIOPSY;   Surgeon: Nephew Jina, MD;  Location: [facility];  Service: General;  Laterality: Right;   RECTOCELE REPAIR     TMJ ARTHROPLASTY     retained hardware   TONSILLECTOMY     Social History   Socioeconomic History   Marital status: Divorced    Spouse name: Not on file   Number of children: 2   Years of education: 12   Highest education level: Not on file  Occupational History   Not on file  Tobacco Use   Smoking status: Every Day    Current packs/day: 0.00    Average packs/day: 0.5 packs/day for 30.0 years (15.0 ttl pk-yrs)    Types: Cigarettes    Start date: 03/24/1983    Last attempt to quit: 03/23/2013    Years since quitting: 11.1   Smokeless tobacco: Never   Tobacco comments:    Quit  7'14, 04-06-16 occ. has a rare ciagarette with stress situations  Substance and Sexual Activity   Alcohol use: Yes    Comment: if so, just once per yr   Drug use: No   Sexual activity: Yes  Other Topics Concern   Not on file  Social History Narrative   Caffeine-no tea/cofee   Drinks diet cokes      Right handed    Social Drivers of Health   Financial Resource Strain: Not on file  Food Insecurity: Not on file  Transportation Needs: Not on file  Physical Activity: Not on file  Stress: Not on file  Social Connections: Unknown (02/01/2022)   Received from [facility]   Social Network    Social Network: Not on file  Intimate Partner Violence: Unknown (12/24/2021)   Received from [facility]   HITS    Physically Hurt: Not on file    Insult or Talk Down To: Not on file    Threaten Physical Harm: Not on file    Scream or Curse: Not on file   Current Outpatient Medications on File Prior to Visit  Medication Sig Dispense Refill   amitriptyline (ELAVIL) 25 MG tablet Take 25 mg by mouth at bedtime.     BD PEN NEEDLE NANO 2ND GEN 32G X 4 MM MISC USE 4 TIMES A DAY 300 each 3   celecoxib  (CELEBREX ) 200 MG capsule Take 200 mg by mouth daily.     Continuous Glucose Sensor (FREESTYLE LIBRE 3  SENSOR) MISC 1 EACH BY DOES NOT APPLY ROUTE EVERY 14 (FOURTEEN) DAYS. 6 each 0   estradiol (VIVELLE-DOT) 0.1 MG/24HR patch Place 1 patch onto the skin 2 (two) times a week.     fenofibrate  160 MG tablet Take 1 tablet (160 mg total) by mouth daily. 30 tablet 0   insulin  regular human CONCENTRATED (HUMULIN  R U-500 KWIKPEN) 500 UNIT/ML KwikPen  USE UP TO 220 UNITS A DAY AS ADVISED 60 mL 1   LORazepam  (ATIVAN ) 1 MG tablet Take 1 mg by mouth daily as needed for anxiety.      omeprazole (PRILOSEC) 20 MG capsule 1 capsule 30 minutes before morning meal Orally Once a day for 30 days     OXcarbazepine  (TRILEPTAL ) 150 MG tablet Take 1 tablet (150 mg total) by mouth at bedtime. 90 tablet 2   pregabalin  (LYRICA ) 50 MG capsule Take 1 capsule (50 mg total) by mouth 2 (two) times daily. 180 capsule 1   rOPINIRole  (REQUIP ) 1 MG tablet Take 1 mg by mouth at bedtime.      telmisartan (MICARDIS) 40 MG tablet Take 40 mg by mouth daily.     zolpidem  (AMBIEN ) 5 MG tablet Take 5 mg by mouth at bedtime.      No current facility-administered medications on file prior to visit.   Allergies  Allergen Reactions   Paroxetine Hcl Other (See Comments)    Suicidal thoughts   Dulaglutide     Other Reaction(s): abd pain and nausea ?pancreatitis   Family History  Problem Relation Age of Onset   Lupus Mother    Diabetes Father    Hypertension Father    Panic disorder Father    Breast cancer Maternal Uncle    PE: BP 124/80 (BP Location: Left Arm, Patient Position: Sitting, Cuff Size: Normal)   Pulse 76   Ht 5' 3.5 (1.613 m)   Wt 207 lb (93.9 kg)   SpO2 95%   BMI 36.09 kg/m  Wt Readings from Last 3 Encounters:  05/09/24 207 lb (93.9 kg)  10/20/23 195 lb 6.4 oz (88.6 kg)  08/15/23 199 lb 3.2 oz (90.4 kg)   Constitutional: overweight, in NAD Eyes:  EOMI, no exophthalmos ENT: no neck masses, no cervical lymphadenopathy Cardiovascular: RRR, No MRG Respiratory: CTA B Musculoskeletal: no deformities Skin:no  rashes Neurological: no tremor with outstretched hands  ASSESSMENT: 1. DM2, insulin -dependent, uncontrolled, with complications - PN  2. HL  PLAN:  1. Patient with longstanding, uncontrolled, type 2 diabetes, very insulin  resistant, on U-500 insulin  along with long-acting insulin , with still poor control.  She returns after longer absence of 9 months.  At last visit, HbA1c was better, at 8.2%, but still above target.  She was not taking Tresiba  consistently and I did advise her to take it every day as sugars were increasing gradually overnight.  We also adjusted her mealtime U-500 insulin . CGM interpretation: -At today's visit, we reviewed her CGM downloads: It appears that 25% of values are in target range (goal >70%), while 75% are higher than 180 (goal <25%), and 0% are lower than 70 (goal <4%).  The calculated average blood sugar is 220.  The projected HbA1c for the next 3 months (GMI) is 8.6%. -Reviewing the CGM trends, sugars appear to fluctuate above the target range and increase particularly during the night.  She is bolusing U-500 insulin  upon waking up but despite this, sugars continue to increase until approximately 11 AM-12 PM, after which they start improving, possibly with a U-500 bolus insulin  before lunch.  She is using higher doses of U-500 and Tresiba  10 recommended at last visit and she appears to need even more.  Unfortunately, we cannot use GLP-1 receptor agonist for her due to previous episode of pancreatitis in 2017-18 and persistent GI symptoms. -At today's visit we discussed about options for treatment.  I did recommend to use the upper legs for injections  but she tried this in the past and developed leg weakness.  In that case, I recommended to move the injections more towards the sides of the abdomen, and sites where she did not inject before to make sure that she is absorbing the insulin .  We also discussed about staying with the higher doses of insulin  within the  recommended intervals. -However, at today's visit, due to her significant insulin  resistance, weight gain, metabolic syndrome with high triglycerides and elevated LFTs possibly in the setting of fatty liver, my recommendation would be for gastric bypass.  I believe that she would benefit from the Roux-en-Y surgery.  She is receptive to this and I placed a referral for her to see bariatric surgery.  If she is not able to go through with the surgery or insurance does not cover it, I believe the next option would be an insulin  pump.  I advised her to look into this and we need to start the pump as soon as possible if she is not going through with the surgery. - I suggested to:  Patient Instructions  Please continue: - U500 90-120 units 45-60 min before meals - stay closer to 120 units with most meals  - Tresiba  60 units daily   Inject in the back of the abdomen.  I referred you to Bariatric surgery.  Let me know if this is not covered - will need an insulin  pump in that case.  Please return 3 months.  - we checked her HbA1c: 8.3% (slightly higher) - advised to check sugars at different times of the day - 4x a day, rotating check times - advised for yearly eye exams >> she is UTD - return to clinic in 3 months  2. HL - She has a history of hypertriglyceridemia leading to pancreatitis in the past - Reviewed latest lipid panel from 10/2023: Triglycerides elevated, in the upper 300s, LDL at goal - Continues fenofibrate  160 mg daily without side effects.  Lela Fendt, MD PhD [facility] Endocrinology

## 2024-05-09 NOTE — Patient Instructions (Addendum)
 Please continue: - U500 90-120 units 45-60 min before meals - stay closer to 120 units with most meals  - Tresiba  60 units daily   Inject in the back of the abdomen.  I referred you to Bariatric surgery.  Let me know if this is not covered - will need an insulin  pump in that case.  Please return 3 months.

## 2024-05-10 ENCOUNTER — Ambulatory Visit: Payer: Self-pay | Admitting: Internal Medicine

## 2024-05-10 LAB — MICROALBUMIN / CREATININE URINE RATIO
Creatinine, Urine: 91 mg/dL (ref 20–275)
Microalb Creat Ratio: 3 mg/g{creat} (ref <30)
Microalb, Ur: 0.3 mg/dL

## 2024-06-09 ENCOUNTER — Other Ambulatory Visit: Payer: Self-pay | Admitting: Internal Medicine

## 2024-06-12 DIAGNOSIS — Z8719 Personal history of other diseases of the digestive system: Secondary | ICD-10-CM | POA: Diagnosis not present

## 2024-06-12 DIAGNOSIS — R110 Nausea: Secondary | ICD-10-CM | POA: Diagnosis not present

## 2024-06-12 DIAGNOSIS — R195 Other fecal abnormalities: Secondary | ICD-10-CM | POA: Diagnosis not present

## 2024-06-12 DIAGNOSIS — R1010 Upper abdominal pain, unspecified: Secondary | ICD-10-CM | POA: Diagnosis not present

## 2024-06-13 ENCOUNTER — Other Ambulatory Visit: Payer: Self-pay | Admitting: Gastroenterology

## 2024-06-13 DIAGNOSIS — R194 Change in bowel habit: Secondary | ICD-10-CM

## 2024-06-13 DIAGNOSIS — R159 Full incontinence of feces: Secondary | ICD-10-CM

## 2024-06-13 DIAGNOSIS — R195 Other fecal abnormalities: Secondary | ICD-10-CM

## 2024-06-13 DIAGNOSIS — R1010 Upper abdominal pain, unspecified: Secondary | ICD-10-CM

## 2024-06-13 DIAGNOSIS — R110 Nausea: Secondary | ICD-10-CM

## 2024-06-13 DIAGNOSIS — Z8719 Personal history of other diseases of the digestive system: Secondary | ICD-10-CM

## 2024-06-14 DIAGNOSIS — R1010 Upper abdominal pain, unspecified: Secondary | ICD-10-CM | POA: Diagnosis not present

## 2024-06-18 ENCOUNTER — Ambulatory Visit
Admission: RE | Admit: 2024-06-18 | Discharge: 2024-06-18 | Disposition: A | Discharge status: Home / Self Care | Source: Ambulatory Visit | Attending: Gastroenterology | Admitting: Gastroenterology

## 2024-06-18 DIAGNOSIS — R195 Other fecal abnormalities: Secondary | ICD-10-CM

## 2024-06-18 DIAGNOSIS — K760 Fatty (change of) liver, not elsewhere classified: Secondary | ICD-10-CM | POA: Diagnosis not present

## 2024-06-18 DIAGNOSIS — R110 Nausea: Secondary | ICD-10-CM

## 2024-06-18 DIAGNOSIS — R194 Change in bowel habit: Secondary | ICD-10-CM

## 2024-06-18 DIAGNOSIS — R1010 Upper abdominal pain, unspecified: Secondary | ICD-10-CM

## 2024-06-18 DIAGNOSIS — R159 Full incontinence of feces: Secondary | ICD-10-CM

## 2024-06-18 DIAGNOSIS — Z8719 Personal history of other diseases of the digestive system: Secondary | ICD-10-CM

## 2024-06-18 MED ORDER — IOPAMIDOL (ISOVUE-300) INJECTION 61%
100.0000 mL | Freq: Once | INTRAVENOUS | Status: AC | PRN
Start: 2024-06-18 — End: 2024-06-18
  Administered 2024-06-18: 100 mL via INTRAVENOUS

## 2024-07-12 ENCOUNTER — Other Ambulatory Visit: Payer: Self-pay | Admitting: Internal Medicine

## 2024-07-18 DIAGNOSIS — Z79899 Other long term (current) drug therapy: Secondary | ICD-10-CM | POA: Diagnosis not present

## 2024-07-18 DIAGNOSIS — G4700 Insomnia, unspecified: Secondary | ICD-10-CM | POA: Diagnosis not present

## 2024-07-18 DIAGNOSIS — I10 Essential (primary) hypertension: Secondary | ICD-10-CM | POA: Diagnosis not present

## 2024-07-18 DIAGNOSIS — G2581 Restless legs syndrome: Secondary | ICD-10-CM | POA: Diagnosis not present

## 2024-08-09 ENCOUNTER — Encounter: Payer: Self-pay | Admitting: Internal Medicine

## 2024-08-09 ENCOUNTER — Ambulatory Visit: Admitting: Internal Medicine

## 2024-08-09 VITALS — BP 124/80 | HR 74 | Ht 63.5 in | Wt 209.4 lb

## 2024-08-09 DIAGNOSIS — E1142 Type 2 diabetes mellitus with diabetic polyneuropathy: Secondary | ICD-10-CM

## 2024-08-09 DIAGNOSIS — E785 Hyperlipidemia, unspecified: Secondary | ICD-10-CM

## 2024-08-09 DIAGNOSIS — E1165 Type 2 diabetes mellitus with hyperglycemia: Secondary | ICD-10-CM | POA: Diagnosis not present

## 2024-08-09 DIAGNOSIS — Z794 Long term (current) use of insulin: Secondary | ICD-10-CM

## 2024-08-09 DIAGNOSIS — E66812 Obesity, class 2: Secondary | ICD-10-CM

## 2024-08-09 LAB — POCT GLYCOSYLATED HEMOGLOBIN (HGB A1C): Hemoglobin A1C: 8.3 % — AB (ref 4.0–5.6)

## 2024-08-09 NOTE — Progress Notes (Signed)
 Patient ID: Diamond [name], female   DOB: 05-Sep-1966, 58 y.o.   MRN: [id number]  HPI: Diamond [name] is a 58 y.o.-year-old female, returning for follow-up for DM2, dx in 2019, insulin -dependent since 2020, uncontrolled, with complications (PN).  Last visit 3 months ago.  Interim history: No increased urination, blurry vision, nausea, chest pain. She continues to have GI issues including nausea and stool incontinence.  She sees GI. At last visit, I referred her to gastric surgery.  They tried to call her twice, but she could not take the call that she was at work.  She did try to call them back but was not able to schedule an appointment.  She is now working from home and will be able to take the calls. She is eating more salads, veggies lately.  Reviewed HbA1c: Lab Results  Component Value Date   HGBA1C 8.3 (A) 05/09/2024   HGBA1C 8.2 (A) 08/15/2023   HGBA1C 8.7 (A) 02/04/2023   HGBA1C 9.4 (A) 09/17/2022   HGBA1C 11.8 (A) 06/16/2022   HGBA1C 12.2 (A) 01/27/2022   HGBA1C 11.6 (A) 11/24/2021   HGBA1C 13.0 (A) 06/11/2021   HGBA1C 13.3 (A) 12/26/2020   HGBA1C 14.5 (H) 05/09/2019   Pt was on: - Tresiba  60 >> 70 >> 80 units daily  - Novolog  10-15 >> 20 units 15 min before meals Tried Metformin  IR and ER >> stopped due inefficacy but also GI symptoms.  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  Highest sugar was   400s >> 300s.  Glucometer:One Touch   Meals: - Breakfast: eggs + sausage - Lunch: sandwich, tuna + crackers - Dinner: salad or  subs - Snacks:I am a snacker veggies, chips, no sweet drinks  - no CKD, last BUN/creatinine:    Lab Results  Component Value Date   BUN 11 10/29/2021   BUN 12 05/09/2019   CREATININE 0.52 10/29/2021   CREATININE 0.58 05/09/2019   Lab Results  Component Value Date   MICRALBCREAT 3 05/09/2024  On telmisartan 40 mg daily.  - + HL: last lipids available for review:  10/25/2022: 179/368/41/79 10/08/2021: 150/204/40/75  Lab Results  Component Value Date   TRIG 938 (H) 06/01/2016  On fenofibrate  160 mg daily.  - last eye exam was in 2025. No DR reportedly. She has OS glaucoma. She had cataract sx OS 12/2023.    - + numbness and tingling in her feet.  On Lyrica  and Elavil per PCP.  Last foot exam 05/09/2024.  She does have a history of HTN, pancreatitis in 2018 due to hypertriglyceridemia, restless leg syndrome, GERD, headaches, endometriosis.  ROS: + see HPI  Past Medical History:  Diagnosis Date   Breast mass    Axillary lump left side   Diabetes mellitus without complication (HCC)    Endometriosis    history of.   GERD (gastroesophageal reflux disease)    Headache    frequent , now less with Prednisone use  of 4 weeks.   History of colon polyps    Hyperlipidemia    Hypertension    Insomnia    Pancreatitis 2017   PONV (postoperative nausea and vomiting)    Restless leg syndrome    Past Surgical History:  Procedure Laterality Date   ABDOMINAL HYSTERECTOMY     fibroids, endometriosis/left oophorectomy, in late 20's   ARTERY BIOPSY Right 04/07/2016   Procedure: BIOPSY TEMPORAL ARTERY RIGHT;  Surgeon: Jina Nephew, MD;  Location: [facility];  Service: General;  Laterality: Right;   bladder tack  2017   COLONOSCOPY WITH PROPOFOL  N/A 06/04/2014   Procedure: COLONOSCOPY WITH PROPOFOL ;  Surgeon: Gladis MARLA Louder, MD;  Location: [facility];  Service: Endoscopy;  Laterality: N/A;, colon polyps removed.   DIAGNOSTIC LAPAROSCOPY     - endometriosis, ovarian cyst, right  oophorectomy   RADIOACTIVE SEED GUIDED EXCISIONAL BREAST BIOPSY Right 05/15/2019   Procedure: RADIOACTIVE SEED GUIDED EXCISIONAL RIGHT BREAST BIOPSY;  Surgeon: Nephew Jina, MD;  Location: [facility];  Service: General;  Laterality: Right;   RECTOCELE REPAIR     TMJ ARTHROPLASTY     retained hardware   TONSILLECTOMY     Social History   Socioeconomic History   Marital status: Divorced    Spouse name: Not on file   Number of children: 2   Years of education: 12   Highest education level: Not on file  Occupational History   Not on file  Tobacco Use   Smoking status: Every Day    Current packs/day: 0.00    Average packs/day: 0.5 packs/day for 30.0 years (15.0 ttl pk-yrs)    Types: Cigarettes    Start date: 03/24/1983    Last attempt to quit: 03/23/2013    Years since quitting: 11.3   Smokeless tobacco: Never   Tobacco comments:    Quit  7'14, 04-06-16 occ. has a rare ciagarette with stress situations  Substance and Sexual Activity   Alcohol use: Yes    Comment: if so, just once per yr   Drug use: No   Sexual activity: Yes  Other Topics Concern   Not on file  Social History Narrative   Caffeine-no tea/cofee   Drinks diet cokes      Right handed    Social Drivers of Health   Financial Resource Strain: Not on file  Food Insecurity: Not on file  Transportation Needs: Not on file  Physical Activity: Not on file  Stress: Not on file  Social Connections: Unknown (02/01/2022)   Received from [facility]   Social Network    Social Network: Not on file  Intimate Partner Violence: Unknown (12/24/2021)   Received from [facility]   HITS    Physically Hurt: Not on file    Insult or Talk Down To: Not on file    Threaten Physical Harm: Not on file    Scream or Curse: Not on file   Current Outpatient Medications on File Prior to Visit  Medication Sig Dispense Refill   amitriptyline (ELAVIL) 25 MG tablet Take 25 mg by mouth at bedtime.     celecoxib  (CELEBREX ) 200 MG capsule  Take 200 mg by mouth daily.     Continuous Glucose Sensor (FREESTYLE LIBRE 3 PLUS SENSOR) MISC 1 each by Does not apply route every 14 (fourteen) days. 6 each 3   estradiol (VIVELLE-DOT) 0.1 MG/24HR patch Place 1 patch onto the skin 2 (two) times a week.     fenofibrate  160 MG tablet Take 1 tablet (  160 mg total) by mouth daily. 30 tablet 0   Insulin  Pen Needle (BD PEN NEEDLE NANO 2ND GEN) 32G X 4 MM MISC USE AS DIRECTED 4 TIMES A DAY 400 each 12   insulin  regular human CONCENTRATED (HUMULIN  R U-500 KWIKPEN) 500 UNIT/ML KwikPen 90-120 units 45-60 min before meals 20 mL 11   LORazepam  (ATIVAN ) 1 MG tablet Take 1 mg by mouth daily as needed for anxiety.      omeprazole (PRILOSEC) 20 MG capsule 1 capsule 30 minutes before morning meal Orally Once a day for 30 days     OXcarbazepine  (TRILEPTAL ) 150 MG tablet Take 1 tablet (150 mg total) by mouth at bedtime. 90 tablet 2   pregabalin  (LYRICA ) 50 MG capsule Take 1 capsule (50 mg total) by mouth 2 (two) times daily. 180 capsule 1   rOPINIRole  (REQUIP ) 1 MG tablet Take 1 mg by mouth at bedtime.      telmisartan (MICARDIS) 40 MG tablet Take 40 mg by mouth daily.     zolpidem  (AMBIEN ) 5 MG tablet Take 5 mg by mouth at bedtime.      No current facility-administered medications on file prior to visit.   Allergies  Allergen Reactions   Paroxetine Hcl Other (See Comments)    Suicidal thoughts   Dulaglutide     Other Reaction(s): abd pain and nausea ?pancreatitis   Family History  Problem Relation Age of Onset   Lupus Mother    Diabetes Father    Hypertension Father    Panic disorder Father    Breast cancer Maternal Uncle    PE: BP 124/80   Pulse 74   Ht 5' 3.5 (1.613 m)   Wt 209 lb 6.4 oz (95 kg)   SpO2 97%   BMI 36.51 kg/m  Wt Readings from Last 3 Encounters:  08/09/24 209 lb 6.4 oz (95 kg)  05/09/24 207 lb (93.9 kg)  10/20/23 195 lb 6.4 oz (88.6 kg)   Constitutional: overweight, in NAD Eyes:  EOMI, no exophthalmos ENT: no neck  masses, no cervical lymphadenopathy Cardiovascular: RRR, No MRG Respiratory: CTA B Musculoskeletal: no deformities Skin:no rashes Neurological: no tremor with outstretched hands  ASSESSMENT: 1. DM2, insulin -dependent, uncontrolled, with complications - PN  2. HL  PLAN:  1. Patient with longstanding, uncontrolled, type 2 diabetes, very insulin  resistant, on U-500 insulin  along with long-acting insulin , with still poor control.  She was previously not taking Tresiba  consistently and we discussed about the importance of doing so.  At last visit, I advised her to stay with higher doses of U-500 insulin  in the recommended dose range.  At that time, sugars were fluctuating above the target range and increasing particularly during the night.  She was bolusing U-500 insulin  upon waking up but despite that sugars continues to increase until approximately 11 AM-12 PM, after which they started to be improving, possibly due to the U-500 insulin  bolus  before lunch.  This unfortunately, we cannot use GLP-1 receptor agonist for her due to previous episode of pancreatitis in 2017-18 and persistent GI symptoms.  At last visit, we discussed about options for treatment.  I recommended to use the upper Let's for injections but she tried this in the past and developed leg weakness.  In that case, I advised her to move the injections more towards the sides of the abdomen and to the sites that she did not inject before, to make sure that she was absorbing the insulin .  However, we also discussed that in  the setting of her insulin  resistance, weight gain, metabolic syndrome with high triglycerides and elevated LFTs and possible fatty liver, my recommendations would be for gastric bypass, particularly for the Roux-en-Y surgery.  She was receptive to this and I placed a referral for her to see the bariatric clinic.  She was not sure whether this would be covered by her insurance and we discussed that if not, we may need an  insulin  pump using U-500 insulin . CGM interpretation: -At today's visit, we reviewed her CGM downloads: It appears that 25% of values are in target range (goal >70%), while 75% are higher than 180 (goal <25%), and 0% are lower than 70 (goal <4%).  The calculated average blood sugar is 217.  The projected HbA1c for the next 3 months (GMI) is 8.5%. -Reviewing the CGM trends, sugars remain elevated, fluctuating mostly above the target range.  We did discuss about using higher doses of the U-500 insulin , states she is still using the 90 units 3 times a day, but we also discussed about injecting the U-500 insulin  in her upper arms.  Also, we absolutely need the gastric bypass surgery.  She discussed with GI since last visit and they also recommended it.  Unfortunately, she missed the telephone calls from the surgical office, and she tried to call back but was not able to schedule an appointment.  At today's visit, I put another referral in and advised her to get in contact with them and schedule the appointment.  She is worried about the diet that she will be placed on before the surgery, but we discussed that she absolutely needs to sedentary and she can work with their nutritionist to adapt the diet to something that she would be able to do. -In the meantime, we also discussed about the metabolic reset by doing a water  only fast for 1 day a week.  I advised her to not take any insulin  that day if she is able to do this - I suggested to:  Patient Instructions  Please continue: - U500 90-120 units 45-60 min before meals - try to inject in the upper arms             - Tresiba  60 units daily   I referred you again to Bariatric surgery.   Please return 3 months.  - we checked her HbA1c: 8.3% (stable) - advised to check sugars at different times of the day - 4x a day, rotating check times - advised for yearly eye exams >> she is UTD - return to clinic in 3 months  2. HL - Has a history of  hypertriglyceridemia leading to pancreatitis in the past - Latest lipid panel was reviewed from 10/2023: Triglycerides elevated, in the upper 300s, LDL at goal - She is on fenofibrate  160 mg daily without side effects  Lela Fendt, MD PhD [facility] Endocrinology

## 2024-08-09 NOTE — Patient Instructions (Addendum)
 Please continue: - U500 90-120 units 45-60 min before meals - try to inject in the upper arms             - Tresiba  60 units daily   I referred you again to Bariatric surgery.   Please return 3 months.

## 2024-10-22 ENCOUNTER — Ambulatory Visit: Payer: BC Managed Care – PPO | Admitting: Family Medicine

## 2024-11-15 ENCOUNTER — Ambulatory Visit: Admitting: Internal Medicine
